# Patient Record
Sex: Male | Born: 1965 | Race: White | Hispanic: No | Marital: Single | Smoking: Current every day smoker
Health system: Southern US, Community
[De-identification: ages and names within clinical notes are randomized; demographics above are authoritative.]

## PROBLEM LIST (undated history)

## (undated) DIAGNOSIS — H269 Unspecified cataract: Secondary | ICD-10-CM

## (undated) DIAGNOSIS — M109 Gout, unspecified: Secondary | ICD-10-CM

## (undated) DIAGNOSIS — I10 Essential (primary) hypertension: Secondary | ICD-10-CM

## (undated) DIAGNOSIS — M1990 Unspecified osteoarthritis, unspecified site: Secondary | ICD-10-CM

## (undated) DIAGNOSIS — F1010 Alcohol abuse, uncomplicated: Secondary | ICD-10-CM

## (undated) DIAGNOSIS — F329 Major depressive disorder, single episode, unspecified: Secondary | ICD-10-CM

## (undated) DIAGNOSIS — J449 Chronic obstructive pulmonary disease, unspecified: Secondary | ICD-10-CM

## (undated) HISTORY — PX: HERNIA REPAIR: SHX51

## (undated) HISTORY — PX: CATARACT EXTRACTION: SUR2

## (undated) HISTORY — PX: OTHER SURGICAL HISTORY: SHX169

## (undated) HISTORY — DX: Alcohol abuse, uncomplicated: F10.10

## (undated) HISTORY — DX: Essential (primary) hypertension: I10

## (undated) HISTORY — DX: Chronic obstructive pulmonary disease, unspecified: J44.9

## (undated) HISTORY — PX: APPENDECTOMY: SHX54

## (undated) HISTORY — DX: Unspecified cataract: H26.9

## (undated) HISTORY — DX: Unspecified osteoarthritis, unspecified site: M19.90

## (undated) HISTORY — DX: Major depressive disorder, single episode, unspecified: F32.9

## (undated) HISTORY — DX: Gout, unspecified: M10.9

---

## 2001-07-08 ENCOUNTER — Encounter: Payer: Self-pay | Admitting: Orthopedic Surgery

## 2001-07-08 ENCOUNTER — Ambulatory Visit (HOSPITAL_COMMUNITY): Admission: RE | Admit: 2001-07-08 | Discharge: 2001-07-08 | Payer: Self-pay | Admitting: Orthopedic Surgery

## 2001-12-31 ENCOUNTER — Ambulatory Visit (HOSPITAL_COMMUNITY): Admission: RE | Admit: 2001-12-31 | Discharge: 2001-12-31 | Payer: Self-pay | Admitting: Neurosurgery

## 2013-06-30 DIAGNOSIS — H2510 Age-related nuclear cataract, unspecified eye: Secondary | ICD-10-CM | POA: Insufficient documentation

## 2013-07-07 DIAGNOSIS — Z961 Presence of intraocular lens: Secondary | ICD-10-CM | POA: Insufficient documentation

## 2013-07-07 DIAGNOSIS — H2512 Age-related nuclear cataract, left eye: Secondary | ICD-10-CM | POA: Insufficient documentation

## 2014-02-28 ENCOUNTER — Ambulatory Visit: Payer: Self-pay | Admitting: Physician Assistant

## 2014-03-02 ENCOUNTER — Encounter: Payer: Self-pay | Admitting: Physician Assistant

## 2014-03-02 ENCOUNTER — Ambulatory Visit (INDEPENDENT_AMBULATORY_CARE_PROVIDER_SITE_OTHER): Payer: BC Managed Care – PPO | Admitting: Physician Assistant

## 2014-03-02 VITALS — BP 133/89 | HR 73 | Ht 71.0 in | Wt 184.0 lb

## 2014-03-02 DIAGNOSIS — I10 Essential (primary) hypertension: Secondary | ICD-10-CM

## 2014-03-02 DIAGNOSIS — M109 Gout, unspecified: Secondary | ICD-10-CM

## 2014-03-02 DIAGNOSIS — M1000 Idiopathic gout, unspecified site: Secondary | ICD-10-CM

## 2014-03-02 DIAGNOSIS — Z720 Tobacco use: Secondary | ICD-10-CM

## 2014-03-02 DIAGNOSIS — Z0000 Encounter for general adult medical examination without abnormal findings: Secondary | ICD-10-CM

## 2014-03-02 DIAGNOSIS — Z131 Encounter for screening for diabetes mellitus: Secondary | ICD-10-CM

## 2014-03-02 DIAGNOSIS — Z23 Encounter for immunization: Secondary | ICD-10-CM

## 2014-03-02 DIAGNOSIS — R079 Chest pain, unspecified: Secondary | ICD-10-CM

## 2014-03-02 DIAGNOSIS — F17200 Nicotine dependence, unspecified, uncomplicated: Secondary | ICD-10-CM

## 2014-03-02 DIAGNOSIS — B351 Tinea unguium: Secondary | ICD-10-CM

## 2014-03-02 DIAGNOSIS — Z13220 Encounter for screening for lipoid disorders: Secondary | ICD-10-CM

## 2014-03-02 MED ORDER — OMEPRAZOLE 40 MG PO CPDR: 40.0000 mg | DELAYED_RELEASE_CAPSULE | Freq: Every day | ORAL | Status: DC

## 2014-03-02 MED ORDER — TERBINAFINE HCL 250 MG PO TABS: 250.0000 mg | ORAL_TABLET | Freq: Every day | ORAL | Status: DC

## 2014-03-02 NOTE — Patient Instructions (Addendum)
Stress test to be schedule.  Will get labs.  Start lamisil after lab result.     Gastroesophageal Reflux Disease, Adult Gastroesophageal reflux disease (GERD) happens when acid from your stomach flows up into the esophagus. When acid comes in contact with the esophagus, the acid causes soreness (inflammation) in the esophagus. Over time, GERD may create small holes (ulcers) in the lining of the esophagus. CAUSES   Increased body weight. This puts pressure on the stomach, making acid rise from the stomach into the esophagus.  Smoking. This increases acid production in the stomach.  Drinking alcohol. This causes decreased pressure in the lower esophageal sphincter (valve or ring of muscle between the esophagus and stomach), allowing acid from the stomach into the esophagus.  Late evening meals and a full stomach. This increases pressure and acid production in the stomach.  A malformed lower esophageal sphincter. Sometimes, no cause is found. SYMPTOMS   Burning pain in the lower part of the mid-chest behind the breastbone and in the mid-stomach area. This may occur twice a week or more often.  Trouble swallowing.  Sore throat.  Dry cough.  Asthma-like symptoms including chest tightness, shortness of breath, or wheezing. DIAGNOSIS  Your caregiver may be able to diagnose GERD based on your symptoms. In some cases, X-rays and other tests may be done to check for complications or to check the condition of your stomach and esophagus. TREATMENT  Your caregiver may recommend over-the-counter or prescription medicines to help decrease acid production. Ask your caregiver before starting or adding any new medicines.  HOME CARE INSTRUCTIONS   Change the factors that you can control. Ask your caregiver for guidance concerning weight loss, quitting smoking, and alcohol consumption.  Avoid foods and drinks that make your symptoms worse, such as:  Caffeine or alcoholic  drinks.  Chocolate.  Peppermint or mint flavorings.  Garlic and onions.  Spicy foods.  Citrus fruits, such as oranges, lemons, or limes.  Tomato-based foods such as sauce, chili, salsa, and pizza.  Fried and fatty foods.  Avoid lying down for the 3 hours prior to your bedtime or prior to taking a nap.  Eat small, frequent meals instead of large meals.  Wear loose-fitting clothing. Do not wear anything tight around your waist that causes pressure on your stomach.  Raise the head of your bed 6 to 8 inches with wood blocks to help you sleep. Extra pillows will not help.  Only take over-the-counter or prescription medicines for pain, discomfort, or fever as directed by your caregiver.  Do not take aspirin, ibuprofen, or other nonsteroidal anti-inflammatory drugs (NSAIDs). SEEK IMMEDIATE MEDICAL CARE IF:   You have pain in your arms, neck, jaw, teeth, or back.  Your pain increases or changes in intensity or duration.  You develop nausea, vomiting, or sweating (diaphoresis).  You develop shortness of breath, or you faint.  Your vomit is green, yellow, black, or looks like coffee grounds or blood.  Your stool is red, bloody, or black. These symptoms could be signs of other problems, such as heart disease, gastric bleeding, or esophageal bleeding. MAKE SURE YOU:   Understand these instructions.  Will watch your condition.  Will get help right away if you are not doing well or get worse. Document Released: 05/29/2005 Document Revised: 11/11/2011 Document Reviewed: 03/08/2011 [facility] Patient Information 2015 [place], [place]. This information is not intended to replace advice given to you by your health care provider. Make sure you discuss any questions you have with your  health care provider.  

## 2014-03-03 ENCOUNTER — Telehealth: Payer: Self-pay | Admitting: Physician Assistant

## 2014-03-03 ENCOUNTER — Telehealth: Payer: Self-pay | Admitting: *Deleted

## 2014-03-03 NOTE — Telephone Encounter (Signed)
No prior auth required for exercise tolerence test. Corliss SkainsJamie Amillia Biffle, CMA

## 2014-03-07 DIAGNOSIS — I10 Essential (primary) hypertension: Secondary | ICD-10-CM

## 2014-03-07 DIAGNOSIS — M109 Gout, unspecified: Secondary | ICD-10-CM

## 2014-03-07 DIAGNOSIS — R079 Chest pain, unspecified: Secondary | ICD-10-CM | POA: Insufficient documentation

## 2014-03-07 HISTORY — DX: Gout, unspecified: M10.9

## 2014-03-07 HISTORY — DX: Essential (primary) hypertension: I10

## 2014-03-07 NOTE — Progress Notes (Signed)
   Subjective:    Patient ID: Gwendlyn DeutscherJeffrey L Pitter, male    DOB: [birth year], 48 y.o.   MRN: [id number]  HPI Patient is a 48 year old male who presents to the clinic to establish care.  .. Active Ambulatory Problems    Diagnosis Date Noted  . Tobacco abuse 03/02/2014  . Gout 03/07/2014  . Acute gout 03/07/2014   Resolved Ambulatory Problems    Diagnosis Date Noted  . No Resolved Ambulatory Problems   No Additional Past Medical History   .Marland Kitchen.History reviewed. No pertinent family history. .. History   Social History  . Marital Status: Single    Spouse Name: N/A    Number of Children: N/A  . Years of Education: N/A   Occupational History  . Not on file.   Social History Main Topics  . Smoking status: Current Every Day Smoker  . Smokeless tobacco: Not on file  . Alcohol Use: Yes  . Drug Use: No  . Sexual Activity: Yes   Other Topics Concern  . Not on file   Social History Narrative  . No narrative on file    Patient presents to the clinic for medication refills and to discuss ongoing chest pain. Patient is on metoprolol 25 mg twice a day. He denies any palpitations, vision changes or headaches. He is having some regular sharp pains in his lower chest/epigastric area. He does notice them more after eating or when he is laying down. He has not noticed them with exertion. He has not tried anything to make better. He denies any fever, chills, nausea or vomiting. He denies any bowel changes. He describes the pain as sharp and can last anywhere from 1 minute to 30 minutes. It is happening at least 34 times a week. He denies any jaw pain or bilateral arm pain.  Patient is not currently on any medication for gout. He usually gets gout in his left foot. He has not had any management done in the last year or so.  He does have some nails that are very hard and turning colors. He would like treatment.    Review of Systems  All other systems reviewed and are negative.       Objective:   Physical Exam  Constitutional: He is oriented to person, place, and time. He appears well-developed and well-nourished.  HENT:  Head: Normocephalic and atraumatic.  Cardiovascular: Normal rate, regular rhythm and normal heart sounds.   Pulmonary/Chest: Effort normal and breath sounds normal. He has no wheezes.  Neurological: He is alert and oriented to person, place, and time.  Skin: Skin is dry.  Great toe bilaterally thick and yellow.   Psychiatric: He has a normal mood and affect. His behavior is normal.          Assessment & Plan:  chest pain- EKG NSR at 71. No ST elevation or depression. No Qwaves. Possible inferior infarct. Will get stress test to further evaluate. Continue on metroprolol daily. Concerned there could be a GERD component to chest pain. Start omeprazole 40mg  daily. Follow up in 4 weeks.   Gout- will wait to look at uric acid levels and kidney function to determine what preventive medication to place pt on.   Tobacco abuse- encouraged pt to quit smoking. He declined any intervention today.   Onychomycosis- will start lamisil after liver enzymes are received.   Screening labs were given to pt today to have drawn.

## 2014-03-09 ENCOUNTER — Ambulatory Visit (HOSPITAL_COMMUNITY)
Admission: RE | Admit: 2014-03-09 | Discharge: 2014-03-09 | Disposition: A | Discharge status: Home / Self Care | Payer: BC Managed Care – PPO | Source: Ambulatory Visit | Attending: Cardiology | Admitting: Cardiology

## 2014-03-09 ENCOUNTER — Other Ambulatory Visit (HOSPITAL_COMMUNITY): Payer: BC Managed Care – PPO

## 2014-03-10 ENCOUNTER — Ambulatory Visit (INDEPENDENT_AMBULATORY_CARE_PROVIDER_SITE_OTHER): Payer: BC Managed Care – PPO | Admitting: Family Medicine

## 2014-03-10 ENCOUNTER — Encounter: Payer: Self-pay | Admitting: Family Medicine

## 2014-03-10 VITALS — BP 152/97 | HR 90 | Wt 183.0 lb

## 2014-03-10 DIAGNOSIS — I861 Scrotal varices: Secondary | ICD-10-CM

## 2014-03-10 MED ORDER — MELOXICAM 15 MG PO TABS: 15.0000 mg | ORAL_TABLET | Freq: Every day | ORAL | Status: DC

## 2014-03-10 NOTE — Progress Notes (Signed)
CC: Michael DeutscherJeffrey L Meza is a 48 y.o. male is here for pulled muscle in groin?   Subjective: HPI:  Complains of right scrotal swelling and pain that began upon awakening 3 days ago. Since then it has slowly been getting better. It is described as having a swollen appearance moderate to mild in severity and a pain described only as pressure in the right scrotum. Pain is nonradiating. Worse to the touch, nothing else makes better or worse. He states it significantly improving without any intervention over the last 3 days. Denies penile discharge, dysuria, nor any other genitourinary complaints. Symptoms above are not influenced by Valsalva, any particular movements, and there have been no changes to his bowel habits. Denies nausea, constipation, fevers, chills, diarrhea, blood in stool.   Review Of Systems Outlined In HPI  No past medical history on file.  No past surgical history on file. No family history on file.  History   Social History  . Marital Status: Single    Spouse Name: N/A    Number of Children: N/A  . Years of Education: N/A   Occupational History  . Not on file.   Social History Main Topics  . Smoking status: Current Every Day Smoker  . Smokeless tobacco: Not on file  . Alcohol Use: Yes  . Drug Use: No  . Sexual Activity: Yes   Other Topics Concern  . Not on file   Social History Narrative  . No narrative on file     Objective: BP 152/97  Pulse 90  Wt 183 lb (83.008 kg)  General: Alert and Oriented, No Acute Distress HEENT: Pupils equal, round, reactive to light. Conjunctivae clear.   Lungs: Clear to auscultation bilaterally, no wheezing/ronchi/rales.  Comfortable work of breathing. Good air movement. Cardiac: Regular rate and rhythm. Normal S1/S2.  No murmurs, rubs, nor gallops.   Abdomen: Normal bowel sounds, soft and non tender without palpable masses. Genitourinary: Unremarkable penis, the left scrotum is slightly tender to touch with a "bag of worms"  feel to the inside of the scrotum along with a nontender testicle with a vertical lie without palpable abnormality. No swelling or tenderness in the inguinal canal Mental Status: No depression, anxiety, nor agitation. Skin: Warm and dry.  Assessment & Plan: Michael Meza was seen today for pulled muscle in groin?.  Diagnoses and associated orders for this visit:  Varicocele - meloxicam (MOBIC) 15 MG tablet; Take 1 tablet (15 mg total) by mouth daily.    Discussed with patient my suspicion of varicocele therefore start meloxicam for pain control, if symptoms are not any better by Monday please call me and I can order a scrotal ultrasound for further evaluation. If symptoms worsen between now and then we can order the ultrasound sooner.Signs and symptoms requring emergent/urgent reevaluation were discussed with the patient.  Return if symptoms worsen or fail to improve.

## 2014-03-10 NOTE — Patient Instructions (Addendum)
Call if no improvement by Monday, if not better we'll want to get an ultrasound.   Varicocele A varicocele is a swelling of veins in the scrotum (the bag of skin that contains the testicles). It is most common in young men. It occurs most often on the left side. Small or painless varicoceles do not need treatment. Most often, this is not a serious problem, but further tests may be needed to confirm the diagnosis. Surgery may be needed if complications of varicoceles arise. Rarely, varicoceles can reoccur after surgery. CAUSES  The swelling is due to blood backing up in the vein that leads from the testicle back to the body. Blood backs up because the valves inside the vein are not working properly. Veins normally return blood to the heart. Valves in veins are supposed to be one-way valves. They should not allow blood to flow backwards. If the valves do not work well, blood can pool in a vein and make it swell. The same thing happens with varicose veins in the leg. SYMPTOMS  A varicocele most often causes no symptoms. When they occur, symptoms include:   Swelling on one side of the scrotum.  Swelling that is more obvious when standing up.  A lumpy feeling in the scrotum.  Heaviness on one side of the scrotum.  Dull ache in the scrotum, especially after exercise or prolonged standing or sitting.  Slower growth or reduced size of the testicle on the side of the varicocele (in young males).  Problems with fertility can arise if the testicle does not grow normally. DIAGNOSIS  Varicocele is usually diagnosed by a physical exam. Sometimes ultrasonography is done. TREATMENT  Usually, varicoceles need no treatment. They are often routinely monitored on exam by your caregiver to ensure they do not slow the growth of the testicle on that side. Treatment may be needed if:  The varicocele is large.  There is a lot of pain.  The varicocele causes a decrease in the size of the testicle in a growing  adolescent.  The other testicle is absent or not normal.  Varicoceles are found on both sides of the scrotum.  There is pain when exercising.  There are fertility problems. There are two types of treatment:  Surgery. The surgeon ties off the swollen veins. Surgery may be done with an incision in the skin or through a laparoscope. The surgery is usually done in an outpatient setting. Outpatient means there is no overnight stay in a hospital.  Embolization. A small tube is placed in a vein and guided into the swollen veins. X-rays are used to guide the small tube. Tiny metal coils or other blocking items are put through the tube. This blocks swollen veins and the flow of blood. This is usually done in an outpatient setting without the use of general anesthesia. HOME CARE INSTRUCTIONS  To decrease discomfort:  Wear supportive underwear.  Use an athletic supporter for sports.  Only take over-the-counter or prescription medicines for pain or discomfort as directed by your caregiver. SEEK MEDICAL CARE IF:   Pain is increasing.  Swelling does not decrease when lying down.  Testicle is smaller.  The testicle becomes enlarged, swollen, red, or painful. Document Released: 11/25/2000 Document Revised: 11/11/2011 Document Reviewed: 11/29/2009 [facility] Patient Information 2015 [place], [place]. This information is not intended to replace advice given to you by your health care provider. Make sure you discuss any questions you have with your health care provider.

## 2014-03-15 ENCOUNTER — Ambulatory Visit (HOSPITAL_COMMUNITY)
Admission: RE | Admit: 2014-03-15 | Discharge: 2014-03-15 | Disposition: A | Discharge status: Home / Self Care | Payer: BC Managed Care – PPO | Source: Ambulatory Visit | Attending: Cardiology | Admitting: Cardiology

## 2014-03-15 DIAGNOSIS — R079 Chest pain, unspecified: Secondary | ICD-10-CM

## 2014-03-15 NOTE — Procedures (Signed)
Exercise Treadmill Test   Test  Exercise Tolerance Test Ordering MD: Tandy GawJade Breeback, PA-C  Interpreting MD:   Unique Test No:1  Treadmill:  1  Indication for ETT: chest pain - rule out ischemia  Contraindication to ETT: Yes   Stress Modality: exercise - treadmill  Cardiac Imaging Performed: non   Protocol: standard Bruce - maximal  Max BP:  208/105  Max MPHR (bpm):  171 85% MPR (bpm):  145  MPHR obtained (bpm):  148 % MPHR obtained:  85  Reached 85% MPHR (min:sec): 9:00 Total Exercise Time (min-sec):  9:00  Workload in METS: 10.10 Borg Scale:   Reason ETT Terminated:  fatigue    ST Segment Analysis At Rest: Sinus rhythm; cannot R/O prior septal MI; no ST changes. With Exercise: no evidence of significant ST depression  Other Information Arrhythmia:  Occasional PVC. Angina during ETT:  absent (0) Quality of ETT:  diagnostic  ETT Interpretation:  normal - no evidence of ischemia by ST analysis  Comments: ETT with no chest pain but the patient did complain of dyspnea; Hypertensive BP response (peak 208/105); no ST changes; negative adequate ETT.  Olga MillersBrian Crenshaw

## 2014-03-16 ENCOUNTER — Telehealth (HOSPITAL_COMMUNITY): Payer: Self-pay | Admitting: *Deleted

## 2014-03-16 ENCOUNTER — Encounter (HOSPITAL_COMMUNITY): Payer: BC Managed Care – PPO

## 2014-03-17 ENCOUNTER — Telehealth: Payer: Self-pay | Admitting: *Deleted

## 2014-03-17 NOTE — Telephone Encounter (Signed)
Spoke with pt to give him his stress test results.  He said that he needed a letter stating that his heart is good so Michael Meza will let him go back to work.

## 2014-03-17 NOTE — Telephone Encounter (Signed)
Ok for letter stating stress test and EKG as well as labwork have been negative for any cardiac cause of chest pains. He is able to return to work with no restrictions.

## 2014-03-17 NOTE — Telephone Encounter (Signed)
Letter in your basket to be signed.

## 2014-03-23 ENCOUNTER — Telehealth: Payer: Self-pay | Admitting: *Deleted

## 2014-03-23 NOTE — Telephone Encounter (Signed)
Pt notified of letter and [facility] at [phone].

## 2014-03-23 NOTE — Telephone Encounter (Signed)
Letter in your in box. 

## 2014-03-23 NOTE — Telephone Encounter (Signed)
Pt called stating that his job needs a note stating that we gave him omeprazole for his chest pains since he had an ok ekg & negative stress test.  I've already sent them all the office notes as well as the ekg & stress test results. He needs this quickly so he can get back to work.

## 2014-03-28 ENCOUNTER — Ambulatory Visit: Payer: BC Managed Care – PPO | Admitting: Physician Assistant

## 2014-04-06 ENCOUNTER — Other Ambulatory Visit: Payer: Self-pay | Admitting: Family Medicine

## 2014-04-11 ENCOUNTER — Ambulatory Visit: Payer: BC Managed Care – PPO | Admitting: Physician Assistant

## 2014-04-18 NOTE — Addendum Note (Signed)
Addended by: Chalmers CaterUTTLE, Jayveion Stalling H on: 04/18/2014 01:53 PM   Modules accepted: Orders

## 2014-05-11 ENCOUNTER — Other Ambulatory Visit: Payer: Self-pay | Admitting: Physician Assistant

## 2014-06-12 ENCOUNTER — Other Ambulatory Visit: Payer: Self-pay | Admitting: Physician Assistant

## 2014-06-14 ENCOUNTER — Ambulatory Visit (INDEPENDENT_AMBULATORY_CARE_PROVIDER_SITE_OTHER): Payer: BC Managed Care – PPO | Admitting: Sports Medicine

## 2014-06-14 ENCOUNTER — Encounter: Payer: Self-pay | Admitting: Sports Medicine

## 2014-06-14 ENCOUNTER — Ambulatory Visit (INDEPENDENT_AMBULATORY_CARE_PROVIDER_SITE_OTHER): Payer: BC Managed Care – PPO

## 2014-06-14 VITALS — BP 130/82 | HR 83 | Ht 71.0 in | Wt 180.0 lb

## 2014-06-14 DIAGNOSIS — M79671 Pain in right foot: Secondary | ICD-10-CM | POA: Diagnosis not present

## 2014-06-14 DIAGNOSIS — R609 Edema, unspecified: Secondary | ICD-10-CM

## 2014-06-14 MED ORDER — DICLOFENAC SODIUM 75 MG PO TBEC: 75.0000 mg | DELAYED_RELEASE_TABLET | Freq: Two times a day (BID) | ORAL | Status: DC

## 2014-06-14 NOTE — Progress Notes (Signed)
   Subjective:    I'm seeing this patient as a consultation for:  Tandy GawJade Breeback, PA-C  CC: Right foot pain  HPI: This is a very pleasant 48 year old male, one month ago he inverted his right ankle, since then he's had pain on the dorsal lateral midfoot, without radiation, there is also visible and palpable nodule that is tender. Moderate, persistent without radiation.  Past medical history, Surgical history, Family history not pertinant except as noted below, Social history, Allergies, and medications have been entered into the medical record, reviewed, and no changes needed.   Review of Systems: No headache, visual changes, nausea, vomiting, diarrhea, constipation, dizziness, abdominal pain, skin rash, fevers, chills, night sweats, weight loss, swollen lymph nodes, body aches, joint swelling, muscle aches, chest pain, shortness of breath, mood changes, visual or auditory hallucinations.   Objective:   General: Well Developed, well nourished, and in no acute distress.  Neuro/Psych: Alert and oriented x3, extra-ocular muscles intact, able to move all 4 extremities, sensation grossly intact. Skin: Warm and dry, no rashes noted.  Respiratory: Not using accessory muscles, speaking in full sentences, trachea midline.  Cardiovascular: Pulses palpable, no extremity edema. Abdomen: Does not appear distended. Right Foot: No visible erythema or swelling. Range of motion is full in all directions. Strength is 5/5 in all directions. No hallux valgus. No pes cavus or pes planus. No abnormal callus noted. No pain over the navicular prominence, or base of fifth metatarsal. No tenderness to palpation of the calcaneal insertion of plantar fascia. No pain at the Achilles insertion. No pain over the calcaneal bursa. No pain of the retrocalcaneal bursa. Visible nodule with tenderness to palpation at the dorsal lateral midfoot. No hallux rigidus or limitus. No tenderness palpation over interphalangeal  joints. No pain with compression of the metatarsal heads. Neurovascularly intact distally.  No evidence of fracture, this is likely a ganglion cyst.  Impression and Recommendations:   This case required medical decision making of moderate complexity.

## 2014-06-14 NOTE — Assessment & Plan Note (Signed)
There does appear to be a ganglion cyst dorsal laterally between the cuboid and the talus. He does endorse an injury. There is significant pes cavus. X-rays, strapped with compressive dressing, Voltaren. Return for custom orthotics, then return in 2 weeks for interventional treatment if no better.

## 2014-07-04 ENCOUNTER — Ambulatory Visit (INDEPENDENT_AMBULATORY_CARE_PROVIDER_SITE_OTHER): Payer: BC Managed Care – PPO | Admitting: Sports Medicine

## 2014-07-04 ENCOUNTER — Encounter: Payer: Self-pay | Admitting: Sports Medicine

## 2014-07-04 VITALS — BP 144/86 | HR 68 | Ht 71.0 in | Wt 188.0 lb

## 2014-07-04 DIAGNOSIS — M79671 Pain in right foot: Secondary | ICD-10-CM

## 2014-07-04 NOTE — Progress Notes (Signed)

## 2014-07-04 NOTE — Assessment & Plan Note (Signed)
Custom orthotics as above. There does appear to be a ganglion cyst dorsolaterally between the cuboid and the talus. Return to see me in 2-3 weeks, injection if no better.

## 2014-07-18 ENCOUNTER — Ambulatory Visit: Payer: BC Managed Care – PPO | Admitting: Sports Medicine

## 2014-07-29 ENCOUNTER — Other Ambulatory Visit: Payer: Self-pay | Admitting: Physician Assistant

## 2014-08-01 ENCOUNTER — Telehealth: Payer: Self-pay | Admitting: *Deleted

## 2014-08-01 ENCOUNTER — Telehealth: Payer: Self-pay | Admitting: Sports Medicine

## 2014-08-01 MED ORDER — DIAZEPAM 5 MG PO TABS: ORAL_TABLET | ORAL | Status: DC

## 2014-08-01 NOTE — Telephone Encounter (Signed)
Michael Meza called and lmom for a call back regarding appt for ganglion cyst. Voicemail full cannot accept any more calls. Michael SkainsJamie Versia Meza, CMA

## 2014-08-01 NOTE — Telephone Encounter (Signed)
Trey PaulaJeff returned my call regarding appointment for cyst removal. He was inquiring about Xanax. Please advise. Corliss SkainsJamie Tocarra Gassen, CMA

## 2014-08-01 NOTE — Telephone Encounter (Signed)
Patient called back and he has an appointment for this Thurs with Dr T and is requesting Xnanx for his nerves. Pt stated that he discussed with Dr. Karie Schwalbe about script. Thanks

## 2014-08-01 NOTE — Telephone Encounter (Signed)
Let's do Valium instead. Prescription is in my box.

## 2014-08-02 NOTE — Telephone Encounter (Signed)
Patient notified. Ralf Konopka, CMA 

## 2014-08-04 ENCOUNTER — Ambulatory Visit (INDEPENDENT_AMBULATORY_CARE_PROVIDER_SITE_OTHER): Payer: BC Managed Care – PPO | Admitting: Sports Medicine

## 2014-08-04 ENCOUNTER — Encounter: Payer: Self-pay | Admitting: Sports Medicine

## 2014-08-04 DIAGNOSIS — M79671 Pain in right foot: Secondary | ICD-10-CM

## 2014-08-04 MED ORDER — CELECOXIB 200 MG PO CAPS: ORAL_CAPSULE | ORAL | Status: DC

## 2014-08-04 NOTE — Progress Notes (Signed)
  Subjective:    CC: Follow-up  HPI: Michael Meza returns, I have not seen him in some time, we made him custom orthotics and he improved significantly from a standpoint of his right foot, he continues to have pain that he localizes at the calcaneal cuboid joint laterally, moderate, persistent without radiation. He also has some disability paperwork that he needs filled out.  Past medical history, Surgical history, Family history not pertinant except as noted below, Social history, Allergies, and medications have been entered into the medical record, reviewed, and no changes needed.   Review of Systems: No fevers, chills, night sweats, weight loss, chest pain, or shortness of breath.   Objective:    General: Well Developed, well nourished, and in no acute distress.  Neuro: Alert and oriented x3, extra-ocular muscles intact, sensation grossly intact.  HEENT: Normocephalic, atraumatic, pupils equal round reactive to light, neck supple, no masses, no lymphadenopathy, thyroid nonpalpable.  Skin: Warm and dry, no rashes. Cardiac: Regular rate and rhythm, no murmurs rubs or gallops, no lower extremity edema.  Respiratory: Clear to auscultation bilaterally. Not using accessory muscles, speaking in full sentences. Right Foot: No visible erythema or swelling. Range of motion is full in all directions. Strength is 5/5 in all directions. No hallux valgus. No pes cavus or pes planus. No abnormal callus noted. No pain over the navicular prominence, or base of fifth metatarsal. No tenderness to palpation of the calcaneal insertion of plantar fascia. No pain at the Achilles insertion. No pain over the calcaneal bursa. No pain of the retrocalcaneal bursa. Tender to palpation at the calcaneal cuboid joint. No hallux rigidus or limitus. No tenderness palpation over interphalangeal joints. No pain with compression of the metatarsal heads. Neurovascularly intact distally.  Procedure: Real-time Ultrasound  Guided Injection of right calcaneocuboid joint Device: GE Logiq E  Verbal informed consent obtained.  Time-out conducted.  Noted no overlying erythema, induration, or other signs of local infection.  Skin prepped in a sterile fashion.  Local anesthesia: Topical Ethyl chloride.  With sterile technique and under real time ultrasound guidance:  1 mL kenalog 40, 1 mL lidocaine injected easily into the calcaneocuboid joint. Completed without difficulty  Pain immediately resolved suggesting accurate placement of the medication.  Advised to call if fevers/chills, erythema, induration, drainage, or persistent bleeding.  Images permanently stored and available for review in the ultrasound unit.  Impression: Technically successful ultrasound guided injection.  Impression and Recommendations:

## 2014-08-04 NOTE — Assessment & Plan Note (Signed)
Injection placed into the calcaneal cuboid joint. Continue custom orthotics. He is getting some gastritis with diclofenac, we are going to switch to Celebrex. Return in one month. Short-term disability forms filled out today.

## 2014-08-22 ENCOUNTER — Ambulatory Visit: Payer: BC Managed Care – PPO | Admitting: Sports Medicine

## 2014-08-22 DIAGNOSIS — Z0289 Encounter for other administrative examinations: Secondary | ICD-10-CM

## 2014-08-31 ENCOUNTER — Ambulatory Visit (INDEPENDENT_AMBULATORY_CARE_PROVIDER_SITE_OTHER): Payer: BC Managed Care – PPO | Admitting: Physician Assistant

## 2014-08-31 ENCOUNTER — Encounter: Payer: Self-pay | Admitting: Physician Assistant

## 2014-08-31 VITALS — BP 157/96 | HR 70 | Ht 71.0 in | Wt 186.0 lb

## 2014-08-31 DIAGNOSIS — F411 Generalized anxiety disorder: Secondary | ICD-10-CM

## 2014-08-31 DIAGNOSIS — F32A Depression, unspecified: Secondary | ICD-10-CM

## 2014-08-31 DIAGNOSIS — Z720 Tobacco use: Secondary | ICD-10-CM

## 2014-08-31 DIAGNOSIS — I10 Essential (primary) hypertension: Secondary | ICD-10-CM

## 2014-08-31 DIAGNOSIS — F329 Major depressive disorder, single episode, unspecified: Secondary | ICD-10-CM

## 2014-08-31 DIAGNOSIS — F430 Acute stress reaction: Principal | ICD-10-CM

## 2014-08-31 DIAGNOSIS — F419 Anxiety disorder, unspecified: Secondary | ICD-10-CM | POA: Insufficient documentation

## 2014-08-31 HISTORY — DX: Depression, unspecified: F32.A

## 2014-08-31 MED ORDER — METOPROLOL TARTRATE 50 MG PO TABS: 50.0000 mg | ORAL_TABLET | Freq: Two times a day (BID) | ORAL | Status: DC

## 2014-08-31 MED ORDER — DIAZEPAM 2 MG PO TABS: 2.0000 mg | ORAL_TABLET | Freq: Two times a day (BID) | ORAL | Status: DC | PRN

## 2014-08-31 NOTE — Progress Notes (Signed)
   Subjective:    Patient ID: Michael DeutscherJeffrey L Capozzi, male    DOB: 1965/10/11, 48 y.o.   MRN: [id number]  HPI Pt presents to the clinic with worsening anxiety. Patient admits he has had some ongoing anxiety and depressive films for the last couple months. However he did lose his job this week and has added to the anxiety and stress. In the past he has gone through periods where he has needed Valium to help him through situations. He has used some of his mother Xanax but that tends to make him like a zombie and not come out. He does not want to try daily medication at this time. He denies any suicidal or homicidal thoughts. He is in the process of interviewing for another job and hopes to be employed by the beginning of the year.  Hypertension-pink his excuses of why his blood pressure is high today he does smoke and drinks a lot of coffee. He denies any chest pains, palpitations, vision changes. He is taking his metoprolol twice a day.   Review of Systems  All other systems reviewed and are negative.      Objective:   Physical Exam  Constitutional: He is oriented to person, place, and time. He appears well-developed and well-nourished.  HENT:  Head: Normocephalic and atraumatic.  Cardiovascular: Normal rate, regular rhythm and normal heart sounds.   Pulmonary/Chest: Effort normal and breath sounds normal.  Neurological: He is alert and oriented to person, place, and time.  Skin: Skin is dry.  Psychiatric: He has a normal mood and affect. His behavior is normal.          Assessment & Plan:  Acute anxiety due to stress/depression- PHQ-9 was 13. GAD-7 was 7. Patient has responded to Valium in the past. I did give him 2 mg as needed twice a day. I discussed with patient this is not a permanent treatment for ongoing anxiety. His symptoms continue we need to talk about a daily medication. Patient is very resistant to this treatment. Also discussed the possibility of talking things out with a  counselor. He declines any counselor referral at this time.   Hypertension- increased metoprolol to 50mg  twice a day. Follow up with recheck in one month. Discussed low salt diet.   Tobacco abuse-obviously this was discussed today. I do not think now also time to quit smoking. We need to get him through this acute anxiety and hopefully focus on this in the next couple months.

## 2014-09-05 ENCOUNTER — Ambulatory Visit: Payer: BC Managed Care – PPO | Admitting: Sports Medicine

## 2014-09-05 DIAGNOSIS — Z0289 Encounter for other administrative examinations: Secondary | ICD-10-CM

## 2014-09-30 ENCOUNTER — Ambulatory Visit: Payer: BC Managed Care – PPO | Admitting: Physician Assistant

## 2014-09-30 ENCOUNTER — Encounter: Payer: Self-pay | Admitting: Physician Assistant

## 2014-09-30 ENCOUNTER — Ambulatory Visit (INDEPENDENT_AMBULATORY_CARE_PROVIDER_SITE_OTHER): Payer: Self-pay | Admitting: Sports Medicine

## 2014-09-30 ENCOUNTER — Ambulatory Visit (INDEPENDENT_AMBULATORY_CARE_PROVIDER_SITE_OTHER): Payer: Self-pay | Admitting: Physician Assistant

## 2014-09-30 ENCOUNTER — Encounter: Payer: Self-pay | Admitting: Sports Medicine

## 2014-09-30 VITALS — BP 150/99 | HR 70 | Ht 71.0 in | Wt 195.0 lb

## 2014-09-30 VITALS — BP 152/89 | HR 70 | Ht 71.0 in | Wt 195.0 lb

## 2014-09-30 DIAGNOSIS — I10 Essential (primary) hypertension: Secondary | ICD-10-CM

## 2014-09-30 DIAGNOSIS — F430 Acute stress reaction: Secondary | ICD-10-CM

## 2014-09-30 DIAGNOSIS — F411 Generalized anxiety disorder: Secondary | ICD-10-CM

## 2014-09-30 DIAGNOSIS — F419 Anxiety disorder, unspecified: Secondary | ICD-10-CM

## 2014-09-30 DIAGNOSIS — Z720 Tobacco use: Secondary | ICD-10-CM

## 2014-09-30 DIAGNOSIS — M79671 Pain in right foot: Secondary | ICD-10-CM

## 2014-09-30 MED ORDER — AMLODIPINE BESYLATE 5 MG PO TABS: 5.0000 mg | ORAL_TABLET | Freq: Every day | ORAL | Status: DC

## 2014-09-30 MED ORDER — METOPROLOL TARTRATE 50 MG PO TABS: 50.0000 mg | ORAL_TABLET | Freq: Two times a day (BID) | ORAL | Status: DC

## 2014-09-30 MED ORDER — DIAZEPAM 2 MG PO TABS: 2.0000 mg | ORAL_TABLET | Freq: Two times a day (BID) | ORAL | Status: DC | PRN

## 2014-09-30 NOTE — Progress Notes (Signed)
   Subjective:    Patient ID: Michael DeutscherJeffrey L Meza, male    DOB: 01-Feb-1966, 49 y.o.   MRN: [id number]  HPI Pt presents to the clinic to follow up on acute anxiety.   Anxiety has improved significantly. 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 He is taking valium but not everyday. It does help some. No suicidal or homicidal thoughts.   HTN- denies any CP, palpitations, headaches or vision changes. Taking metoprolol twice daily. Not checking bp's at home.    Review of Systems  All other systems reviewed and are negative.      Objective:   Physical Exam  Constitutional: He is oriented to person, place, and time. He appears well-developed and well-nourished.  HENT:  Head: Normocephalic and atraumatic.  Cardiovascular: Normal rate, regular rhythm and normal heart sounds.   Pulmonary/Chest: Effort normal and breath sounds normal. He has no wheezes.  Neurological: He is alert and oriented to person, place, and time.  Psychiatric: He has a normal mood and affect. His behavior is normal.          Assessment & Plan:  Anxiety as acute reaction to stress- GAD-7 was 2. PHQ-9 was 5. Refilled Valium. Discussed not use unless needed. Goal would be 1-3 times a week. If needing more needs to discuss daily medication. Appears that getting a new job has decreased anxiety. Follow up in 3 months.   HTN- BP elevated today. Per pt he states always elevated at doctors office. Given norvasc to start do not want to increase metoprolol since HR might drop too low. Discussed can give me 2 weeks of under 140/90 BP's readings at home then does not need to start. If not needs to start norvasc, if start follow up in 1 month if not then Follow up in 3 months.   Needs CPE. Should have insurance soon. Needs labs.   Tobacco abuse- not ready to quit today.

## 2014-09-30 NOTE — Progress Notes (Signed)
  Subjective:    CC: follow-up  HPI: Right foot pain: Overall pain-free now after custom orthotics and a right calcaneocuboid joint injection as above.  He did lose his job, and is coming off of temporary disability, he did find a job [place] in [place]. He does have some forms he needs me to fill out.  Past medical history, Surgical history, Family history not pertinant except as noted below, Social history, Allergies, and medications have been entered into the medical record, reviewed, and no changes needed.   Review of Systems: No fevers, chills, night sweats, weight loss, chest pain, or shortness of breath.   Objective:    General: Well Developed, well nourished, and in no acute distress.  Neuro: Alert and oriented x3, extra-ocular muscles intact, sensation grossly intact.  HEENT: Normocephalic, atraumatic, pupils equal round reactive to light, neck supple, no masses, no lymphadenopathy, thyroid nonpalpable.  Skin: Warm and dry, no rashes. Cardiac: Regular rate and rhythm, no murmurs rubs or gallops, no lower extremity edema.  Respiratory: Clear to auscultation bilaterally. Not using accessory muscles, speaking in full sentences. Right Foot: No visible erythema or swelling. Range of motion is full in all directions. Strength is 5/5 in all directions. No hallux valgus. No pes cavus or pes planus. No abnormal callus noted. No pain over the navicular prominence, or base of fifth metatarsal. No tenderness to palpation of the calcaneal insertion of plantar fascia. No pain at the Achilles insertion. No pain over the calcaneal bursa. No pain of the retrocalcaneal bursa. No tenderness to palpation over the tarsals, metatarsals, or phalanges. No hallux rigidus or limitus. No tenderness palpation over interphalangeal joints. No pain with compression of the metatarsal heads. Neurovascularly intact distally.  I spent 25 minutes with this patient, greater than 50% was face-to-face time  counseling regarding the above diagnosis as well as completing forms.  Impression and Recommendations:

## 2014-09-30 NOTE — Assessment & Plan Note (Signed)
Pain-free now after custom orthotics and a calcaneocuboid joint injection at the last visit. He is going to start a new job in [place] coming up. [employer] paperwork filled out today for his short term disability. Return as needed.

## 2014-11-01 ENCOUNTER — Other Ambulatory Visit: Payer: Self-pay | Admitting: Physician Assistant

## 2014-11-08 ENCOUNTER — Other Ambulatory Visit: Payer: Self-pay | Admitting: Physician Assistant

## 2014-11-16 ENCOUNTER — Other Ambulatory Visit: Payer: Self-pay | Admitting: Physician Assistant

## 2014-12-07 ENCOUNTER — Encounter: Payer: Self-pay | Admitting: Physician Assistant

## 2014-12-19 ENCOUNTER — Encounter: Payer: Self-pay | Admitting: Physician Assistant

## 2014-12-21 ENCOUNTER — Encounter: Payer: Self-pay | Admitting: Physician Assistant

## 2014-12-24 ENCOUNTER — Other Ambulatory Visit: Payer: Self-pay | Admitting: Physician Assistant

## 2015-02-06 ENCOUNTER — Other Ambulatory Visit: Payer: Self-pay | Admitting: Physician Assistant

## 2015-02-27 ENCOUNTER — Other Ambulatory Visit: Payer: Self-pay | Admitting: *Deleted

## 2015-02-27 ENCOUNTER — Telehealth: Payer: Self-pay | Admitting: Physician Assistant

## 2015-02-27 MED ORDER — METOPROLOL TARTRATE 50 MG PO TABS: 50.0000 mg | ORAL_TABLET | Freq: Two times a day (BID) | ORAL | Status: DC

## 2015-02-27 MED ORDER — AMLODIPINE BESYLATE 5 MG PO TABS: 5.0000 mg | ORAL_TABLET | Freq: Every day | ORAL | Status: DC

## 2015-02-27 NOTE — Telephone Encounter (Signed)
Patient called and request to know if he can have a med refill for prilosec, bp med & diazepam. Pt states his insurance runs out at the end of June and trying to save money with insurance cost $14.00 and with out its $140.00. Req to have meds called into pharmacy. Thanks

## 2015-02-27 NOTE — Telephone Encounter (Signed)
Pt notified to make an appt when he's back in town.

## 2015-02-27 NOTE — Telephone Encounter (Signed)
I've already refilled his BP meds but are you ok with refilling his diazepam? Please advise.

## 2015-02-27 NOTE — Telephone Encounter (Signed)
Since diazepam is a controlled substances this would still require office visits.

## 2015-03-01 ENCOUNTER — Encounter: Payer: Self-pay | Admitting: Family Medicine

## 2015-05-22 ENCOUNTER — Encounter: Payer: Self-pay | Admitting: Physician Assistant

## 2015-05-23 ENCOUNTER — Telehealth: Payer: Self-pay | Admitting: Physician Assistant

## 2015-05-23 ENCOUNTER — Other Ambulatory Visit: Payer: Self-pay | Admitting: Physician Assistant

## 2015-05-23 NOTE — Telephone Encounter (Signed)
Pt called.Marland Kitchen He wants to be put on Colchicine 0.6 mg this is the med his previous Dr had him on for Gout. The med we prescribed is too expensive, its costing him $300 whilst the other $14.00.  Thank you.

## 2015-05-23 NOTE — Telephone Encounter (Signed)
[name], Pt called.  He would like rx for Colchine 0.6 mg for his gout. His pcp prior to seeing you prescribed this med, he said the one prescribed by you is too expensive. He's been out of work because of the amount of pain he's been  in and is trying to go back to work tomorrow.  Thank you.

## 2015-05-24 ENCOUNTER — Other Ambulatory Visit: Payer: Self-pay | Admitting: Physician Assistant

## 2015-05-24 MED ORDER — COLCHICINE 0.6 MG PO TABS: ORAL_TABLET | ORAL | Status: DC

## 2015-06-15 NOTE — Telephone Encounter (Signed)
Encounter complete. 

## 2015-06-18 ENCOUNTER — Other Ambulatory Visit: Payer: Self-pay | Admitting: Physician Assistant

## 2015-07-04 ENCOUNTER — Telehealth: Payer: Self-pay | Admitting: Physician Assistant

## 2015-07-04 ENCOUNTER — Ambulatory Visit: Payer: Self-pay | Admitting: Physician Assistant

## 2015-07-04 NOTE — Telephone Encounter (Signed)
We need to recheck uric acid. We do not have on file. Likely needs to start a preventative. We really need appt. Do you have colchine?

## 2015-07-04 NOTE — Telephone Encounter (Signed)
Pt called. Gout medicine is not working and he wants to change to something else.

## 2015-07-21 ENCOUNTER — Other Ambulatory Visit: Payer: Self-pay | Admitting: Physician Assistant

## 2015-08-03 NOTE — Telephone Encounter (Signed)
Pt made several appts for this but never came in.

## 2015-08-04 ENCOUNTER — Other Ambulatory Visit: Payer: Self-pay

## 2015-08-04 MED ORDER — METOPROLOL TARTRATE 50 MG PO TABS: ORAL_TABLET | ORAL | Status: DC

## 2015-08-31 ENCOUNTER — Other Ambulatory Visit: Payer: Self-pay | Admitting: Physician Assistant

## 2015-09-01 ENCOUNTER — Ambulatory Visit (INDEPENDENT_AMBULATORY_CARE_PROVIDER_SITE_OTHER): Payer: Commercial Managed Care - PPO | Admitting: Physician Assistant

## 2015-09-01 ENCOUNTER — Encounter: Payer: Self-pay | Admitting: Physician Assistant

## 2015-09-01 ENCOUNTER — Ambulatory Visit (INDEPENDENT_AMBULATORY_CARE_PROVIDER_SITE_OTHER): Payer: Commercial Managed Care - PPO

## 2015-09-01 ENCOUNTER — Other Ambulatory Visit: Payer: Self-pay | Admitting: Family Medicine

## 2015-09-01 VITALS — BP 138/94 | HR 86 | Ht 71.0 in | Wt 187.0 lb

## 2015-09-01 DIAGNOSIS — F411 Generalized anxiety disorder: Secondary | ICD-10-CM

## 2015-09-01 DIAGNOSIS — Z0000 Encounter for general adult medical examination without abnormal findings: Secondary | ICD-10-CM

## 2015-09-01 DIAGNOSIS — Z23 Encounter for immunization: Secondary | ICD-10-CM

## 2015-09-01 DIAGNOSIS — Z13220 Encounter for screening for lipoid disorders: Secondary | ICD-10-CM

## 2015-09-01 DIAGNOSIS — M79672 Pain in left foot: Secondary | ICD-10-CM | POA: Diagnosis not present

## 2015-09-01 DIAGNOSIS — Z0001 Encounter for general adult medical examination with abnormal findings: Secondary | ICD-10-CM | POA: Diagnosis not present

## 2015-09-01 DIAGNOSIS — Z720 Tobacco use: Secondary | ICD-10-CM

## 2015-09-01 DIAGNOSIS — I10 Essential (primary) hypertension: Secondary | ICD-10-CM

## 2015-09-01 DIAGNOSIS — M79671 Pain in right foot: Secondary | ICD-10-CM

## 2015-09-01 DIAGNOSIS — Z131 Encounter for screening for diabetes mellitus: Secondary | ICD-10-CM

## 2015-09-01 DIAGNOSIS — F430 Acute stress reaction: Secondary | ICD-10-CM

## 2015-09-01 DIAGNOSIS — M79676 Pain in unspecified toe(s): Secondary | ICD-10-CM

## 2015-09-01 DIAGNOSIS — F419 Anxiety disorder, unspecified: Secondary | ICD-10-CM

## 2015-09-01 MED ORDER — TERBINAFINE HCL 250 MG PO TABS: 250.0000 mg | ORAL_TABLET | Freq: Every day | ORAL | Status: AC

## 2015-09-01 MED ORDER — METOPROLOL TARTRATE 50 MG PO TABS: ORAL_TABLET | ORAL | Status: DC

## 2015-09-01 MED ORDER — FLUOXETINE HCL 20 MG PO TABS: 20.0000 mg | ORAL_TABLET | Freq: Every day | ORAL | Status: DC

## 2015-09-01 MED ORDER — COLCHICINE 0.6 MG PO TABS: ORAL_TABLET | ORAL | Status: DC

## 2015-09-01 MED ORDER — OMEPRAZOLE 40 MG PO CPDR: 40.0000 mg | DELAYED_RELEASE_CAPSULE | Freq: Every day | ORAL | Status: DC

## 2015-09-01 MED ORDER — LORAZEPAM 0.5 MG PO TABS: 0.5000 mg | ORAL_TABLET | Freq: Two times a day (BID) | ORAL | Status: DC

## 2015-09-01 NOTE — Patient Instructions (Addendum)

## 2015-09-05 ENCOUNTER — Telehealth: Payer: Self-pay

## 2015-09-05 ENCOUNTER — Other Ambulatory Visit: Payer: Self-pay

## 2015-09-05 NOTE — Progress Notes (Signed)
Subjective:    Patient ID: Michael Meza, male    DOB: 01-04-1966, 50 y.o.   MRN: [id number]  HPI Pt is a 50 yo male who presents to the clinic for CPE.   Pt does have some ongoing off and on again gout flares of bilateral great toe. No current flare. Not taking colchine daily but only as needed. It was written for daily.   Pt continues to have a lot of anxiety. Not taking prozac but got insurance so should be able to afford now. Valium does not help anxiety at all. He feels really worked up at night and just can't get anything done. He has no motivation to clean house or anything but work and sleep. No suicidal thoughts.    .. Active Ambulatory Problems    Diagnosis Date Noted  . Tobacco abuse 03/02/2014  . Gout 03/07/2014  . Acute gout 03/07/2014  . Essential hypertension, benign 03/07/2014  . Pain in the chest 03/07/2014  . Right foot pain 06/14/2014  . Anxiety as acute reaction to exceptional stress 08/31/2014  . Depression 08/31/2014   Resolved Ambulatory Problems    Diagnosis Date Noted  . No Resolved Ambulatory Problems   No Additional Past Medical History   .Marland Kitchen. Social History   Social History  . Marital Status: Single    Spouse Name: N/A  . Number of Children: N/A  . Years of Education: N/A   Occupational History  . Not on file.   Social History Main Topics  . Smoking status: Current Every Day Smoker  . Smokeless tobacco: Not on file  . Alcohol Use: Yes  . Drug Use: No  . Sexual Activity: Yes   Other Topics Concern  . Not on file   Social History Narrative      Review of Systems  All other systems reviewed and are negative.      Objective:   Physical Exam BP 138/94 mmHg  Pulse 86  Ht 5\' 11"  (1.803 m)  Wt 187 lb (84.823 kg)  BMI 26.09 kg/m2  General Appearance:    Alert, cooperative, no distress, appears stated age  Head:    Normocephalic, without obvious abnormality, atraumatic  Eyes:    PERRL, conjunctiva/corneas clear, EOM's  intact, fundi    benign, both eyes       Ears:    Normal TM's and external ear canals, both ears  Nose:   Nares normal, septum midline, mucosa normal, no drainage    or sinus tenderness  Throat:   Lips, mucosa, and tongue normal; teeth and gums normal  Neck:   Supple, symmetrical, trachea midline, no adenopathy;       thyroid:  No enlargement/tenderness/nodules; no carotid   bruit or JVD  Back:     Symmetric, no curvature, ROM normal, no CVA tenderness  Lungs:     Clear to auscultation bilaterally, respirations unlabored  Chest wall:    No tenderness or deformity  Heart:    Regular rate and rhythm, S1 and S2 normal, no murmur, rub   or gallop  Abdomen:     Soft, non-tender, bowel sounds active all four quadrants,    no masses, no organomegaly  Genitalia:  Pt declined.   Rectal:  Pt declined.  Extremities:   Extremities normal, atraumatic, no cyanosis or edema  Pulses:   2+ and symmetric all extremities  Skin:   Skin color, texture, turgor normal, no rashes or lesions  Lymph nodes:   Cervical, supraclavicular, and axillary  nodes normal  Neurologic:   CNII-XII intact. Normal strength, sensation and reflexes      throughout         Assessment & Plan:  CPE- AUA was 2. Declined DRE. Will order PSA. Flu shot given. Fasting labs ordered.   Bilateral great toe pain- ordered uric acid to assess gout status. Pt was not taking colchine for prevention but only as needed. No pain or flare today. Will get xray as well today.   Anxiety- stop valium. Start ativan see if helps any better. No more than twice a day. prozac to restart. Declined buspar to try.   HTN- metoprolol refilled, controlled.   GERD- prilosec refilled, controlled.

## 2015-09-05 NOTE — Telephone Encounter (Signed)
Patient aware of both foot xray results.

## 2015-09-05 NOTE — Telephone Encounter (Signed)
-----   Message from Jomarie LongsJade L Breeback, [place] sent at 09/01/2015  4:01 PM EST ----- Call pt: left great toe arthritis.

## 2015-09-09 LAB — COMPLETE METABOLIC PANEL WITH GFR
ALT: 33 U/L (ref 9–46)
AST: 34 U/L (ref 10–35)
Albumin: 4 g/dL (ref 3.6–5.1)
Alkaline Phosphatase: 62 U/L (ref 40–115)
BUN: 6 mg/dL — ABNORMAL LOW (ref 7–25)
CO2: 28 mmol/L (ref 20–31)
Calcium: 9 mg/dL (ref 8.6–10.3)
Chloride: 102 mmol/L (ref 98–110)
Creat: 0.63 mg/dL — ABNORMAL LOW (ref 0.70–1.33)
GFR, Est African American: >89 mL/min (ref >=60)
GFR, Est Non African American: >89 mL/min (ref >=60)
Glucose, Bld: 84 mg/dL (ref 65–99)
Potassium: 4.1 mmol/L (ref 3.5–5.3)
Sodium: 136 mmol/L (ref 135–146)
Total Bilirubin: 0.5 mg/dL (ref 0.2–1.2)
Total Protein: 6.4 g/dL (ref 6.1–8.1)

## 2015-09-09 LAB — LIPID PANEL
Cholesterol: 112 mg/dL — ABNORMAL LOW (ref 125–200)
HDL: 42 mg/dL (ref >=40)
LDL Cholesterol: 53 mg/dL (ref <130)
Total CHOL/HDL Ratio: 2.7 Ratio (ref <=5.0)
Triglycerides: 86 mg/dL (ref <150)
VLDL: 17 mg/dL (ref <30)

## 2015-09-09 LAB — PSA: PSA: 2.22 ng/mL (ref <=4.00)

## 2015-09-09 LAB — URIC ACID: Uric Acid, Serum: 7.6 mg/dL (ref 4.0–7.8)

## 2015-09-13 MED ORDER — COLCHICINE 0.6 MG PO TABS: ORAL_TABLET | ORAL | Status: DC

## 2015-09-13 MED ORDER — ALLOPURINOL 100 MG PO TABS: 100.0000 mg | ORAL_TABLET | Freq: Two times a day (BID) | ORAL | Status: DC

## 2015-09-13 NOTE — Addendum Note (Signed)
Addended by: Jomarie LongsBREEBACK, [name] L on: 09/13/2015 05:10 PM   Modules accepted: Orders

## 2015-10-12 DIAGNOSIS — H25812 Combined forms of age-related cataract, left eye: Secondary | ICD-10-CM | POA: Insufficient documentation

## 2016-03-29 ENCOUNTER — Other Ambulatory Visit: Payer: Self-pay | Admitting: Physician Assistant

## 2016-04-02 ENCOUNTER — Encounter: Payer: Self-pay | Admitting: Physician Assistant

## 2016-04-02 ENCOUNTER — Ambulatory Visit (INDEPENDENT_AMBULATORY_CARE_PROVIDER_SITE_OTHER): Payer: Self-pay | Admitting: Physician Assistant

## 2016-04-02 VITALS — BP 138/82 | HR 67 | Ht 71.0 in | Wt 195.0 lb

## 2016-04-02 DIAGNOSIS — M1000 Idiopathic gout, unspecified site: Secondary | ICD-10-CM

## 2016-04-02 DIAGNOSIS — Z720 Tobacco use: Secondary | ICD-10-CM

## 2016-04-02 DIAGNOSIS — I10 Essential (primary) hypertension: Secondary | ICD-10-CM

## 2016-04-02 DIAGNOSIS — F430 Acute stress reaction: Secondary | ICD-10-CM

## 2016-04-02 DIAGNOSIS — F411 Generalized anxiety disorder: Secondary | ICD-10-CM

## 2016-04-02 DIAGNOSIS — F419 Anxiety disorder, unspecified: Secondary | ICD-10-CM

## 2016-04-02 MED ORDER — LORAZEPAM 0.5 MG PO TABS: 0.5000 mg | ORAL_TABLET | Freq: Two times a day (BID) | ORAL | 2 refills | Status: DC

## 2016-04-02 MED ORDER — METOPROLOL TARTRATE 50 MG PO TABS: ORAL_TABLET | ORAL | 5 refills | Status: DC

## 2016-04-02 MED ORDER — ALLOPURINOL 100 MG PO TABS: 100.0000 mg | ORAL_TABLET | Freq: Two times a day (BID) | ORAL | 5 refills | Status: DC

## 2016-04-02 MED ORDER — COLCHICINE 0.6 MG PO TABS: ORAL_TABLET | ORAL | 5 refills | Status: DC

## 2016-04-02 MED ORDER — OMEPRAZOLE 40 MG PO CPDR: 40.0000 mg | DELAYED_RELEASE_CAPSULE | Freq: Every day | ORAL | 5 refills | Status: DC

## 2016-04-02 NOTE — Patient Instructions (Signed)

## 2016-04-03 NOTE — Progress Notes (Signed)
   Subjective:    Patient ID: Michael Meza, male    DOB: 07-19-1966, 50 y.o.   MRN: [id number]  HPI Patient is a 50 year old male who presents to the clinic with a history of gout who comes in to have refills on his medications. 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 He has been out of his allopurinol and colchicine. She restarted him on caution thing. He has not restarted allopurinol. Got was in his right foot. It has improved significantly.  He does need medication refills on his other meds. He currently does not have insurance and requests to get 6 month supply and hopefully he gets insurance and can have more screening labs done.   Review of Systems  All other systems reviewed and are negative.      Objective:   Physical Exam  Constitutional: He is oriented to person, place, and time. He appears well-developed and well-nourished.  HENT:  Head: Normocephalic and atraumatic.  Right Ear: External ear normal.  Left Ear: External ear normal.  Pulmonary/Chest: Effort normal and breath sounds normal.  Neurological: He is alert and oriented to person, place, and time.  Skin: Skin is dry.  Psychiatric: He has a normal mood and affect. His behavior is normal.          Assessment & Plan:  Gout- restart allopurinol 100mg  bid. colchine qd for next 3-6 months. Strongly encouraged patient to stop smoking and stop drinking alcohol.  HTN- refilled metoprolol. Recheck blood pressure and improve significantly from first check.  GERD- refilled omeprazole.   GAD- as needed ativan. Pt has tried preventative SSRI and SSNRI in past and stated did not work.   Patient does not have insurance and request not to do any labs today. When he does get insurance he will come in for complete physical.

## 2016-06-24 ENCOUNTER — Other Ambulatory Visit: Payer: Self-pay | Admitting: *Deleted

## 2016-06-24 MED ORDER — ALLOPURINOL 100 MG PO TABS: 100.0000 mg | ORAL_TABLET | Freq: Two times a day (BID) | ORAL | 0 refills | Status: DC

## 2016-06-24 NOTE — Progress Notes (Signed)
Request for 90 day  supply

## 2016-08-10 ENCOUNTER — Other Ambulatory Visit: Payer: Self-pay | Admitting: Physician Assistant

## 2016-08-28 ENCOUNTER — Telehealth: Payer: Self-pay | Admitting: Physician Assistant

## 2016-08-28 NOTE — Telephone Encounter (Signed)
Pt called in requesting a refill on his reflux medication. He stated he has been sick to his stomach and is sure he has an ulcer. He is also interested in a prescription for Fish Oil. Thanks!

## 2016-08-29 NOTE — Telephone Encounter (Signed)
Spoke with pt about his meds.  Omeprazole was already sent to pharmacy but is too expensive without ins so I advised him to just do OTC for now.

## 2016-10-29 ENCOUNTER — Encounter: Payer: Self-pay | Admitting: Physician Assistant

## 2016-10-30 ENCOUNTER — Ambulatory Visit (INDEPENDENT_AMBULATORY_CARE_PROVIDER_SITE_OTHER): Payer: BLUE CROSS/BLUE SHIELD | Admitting: Physician Assistant

## 2016-10-30 ENCOUNTER — Encounter: Payer: Self-pay | Admitting: Physician Assistant

## 2016-10-30 VITALS — BP 135/73 | HR 78 | Ht 71.0 in | Wt 188.0 lb

## 2016-10-30 DIAGNOSIS — F1010 Alcohol abuse, uncomplicated: Secondary | ICD-10-CM | POA: Diagnosis not present

## 2016-10-30 DIAGNOSIS — Z131 Encounter for screening for diabetes mellitus: Secondary | ICD-10-CM

## 2016-10-30 DIAGNOSIS — F419 Anxiety disorder, unspecified: Secondary | ICD-10-CM

## 2016-10-30 DIAGNOSIS — Z0000 Encounter for general adult medical examination without abnormal findings: Secondary | ICD-10-CM | POA: Diagnosis not present

## 2016-10-30 DIAGNOSIS — I10 Essential (primary) hypertension: Secondary | ICD-10-CM

## 2016-10-30 DIAGNOSIS — Z720 Tobacco use: Secondary | ICD-10-CM | POA: Diagnosis not present

## 2016-10-30 DIAGNOSIS — M1A00X0 Idiopathic chronic gout, unspecified site, without tophus (tophi): Secondary | ICD-10-CM

## 2016-10-30 DIAGNOSIS — Z13220 Encounter for screening for lipoid disorders: Secondary | ICD-10-CM | POA: Diagnosis not present

## 2016-10-30 HISTORY — DX: Alcohol abuse, uncomplicated: F10.10

## 2016-10-30 MED ORDER — METOPROLOL TARTRATE 50 MG PO TABS: ORAL_TABLET | ORAL | 5 refills | Status: DC

## 2016-10-30 MED ORDER — COLCHICINE 0.6 MG PO TABS: ORAL_TABLET | ORAL | 5 refills | Status: DC

## 2016-10-30 NOTE — Patient Instructions (Signed)

## 2016-10-30 NOTE — Progress Notes (Addendum)
Subjective:     Patient ID: Michael Meza, male   DOB: 1966/09/01, 51 y.o.   MRN: [id number]  HPI  Patient states he has been smoking at least 2 packs per day. Patient is interested in stopping smoking. 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 He also states he "binge drinks" because "I like to party and never grew out of it." He reports drinking 6-24 beers on the weekends until he passes out. 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 Stopped drinking for 2 months but has since restarted. Reports history of DUI. States this has discord with her daughter and believes it is leading to his increased anxiety.   Takes Lorazepam once daily for anxiety but states this does nothing for him. Requests xanax. States he has previously tried Prozac and experienced insomnia and nightmares which has made him afraid of all SSRIs.   States his stomach and throat have been hurting recently. Endorses acid reflux and states he needs to buy Prilosec and begin taking it again  States he sometimes chokes on tootsie rolls when he swallows and has to cough it up but this only occurs once every 3 months.   Has not had gout flare in several months. States he is running low on colchicene.  Active Ambulatory Problems    Diagnosis Date Noted  . Tobacco abuse 03/02/2014  . Gout 03/07/2014  . Acute gout 03/07/2014  . Essential hypertension, benign 03/07/2014  . Pain in the chest 03/07/2014  . Right foot pain 06/14/2014  . Anxiety as acute reaction to exceptional stress 08/31/2014  . Depression 08/31/2014   Resolved Ambulatory Problems    Diagnosis Date Noted  . No Resolved Ambulatory Problems   No Additional Past Medical History    Current Outpatient Prescriptions:  .  colchicine 0.6 MG tablet, Take one tablet daily., Disp: 30 tablet, Rfl: 5 .  metoprolol (LOPRESSOR) 50 MG tablet, TAKE 1 TABLET (50 MG TOTAL) BY MOUTH 2 (TWO) TIMES DAILY., Disp: 60 tablet, Rfl: 5 .   omeprazole (PRILOSEC) 40 MG capsule, TAKE 1 CAPSULE (40 MG TOTAL) BY MOUTH DAILY., Disp: 30 capsule, Rfl: 5  Social History   Social History  . Marital status: Single    Spouse name: N/A  . Number of children: N/A  . Years of education: N/A   Occupational History  . Not on file.   Social History Main Topics  . Smoking status: Current Every Day Smoker  . Smokeless tobacco: Never Used  . Alcohol use Yes  . Drug use: No  . Sexual activity: Yes   Other Topics Concern  . Not on file   Social History Narrative  . No narrative on file   IPSS Questionnaire (AUA-7): Over the past month.   1)  How often have you had a sensation of not emptying your bladder completely after you finish urinating?  0 - Not at all  2)  How often have you had to urinate again less than two hours after you finished urinating? 0 - Not at all  3)  How often have you found you stopped and started again several times when you urinated?  0 - Not at all  4) How difficult have you found it to postpone urination?  1 - Less than 1 time in 5  5) How often have you had a weak urinary stream?  0 - Not at all  6) How often have you had  to push or strain to begin urination?  0 - Not at all  7) How many times did you most typically get up to urinate from the time you went to bed until the time you got up in the morning?  0 - None  Total score: 1 0-7 mildly symptomatic   8-19 moderately symptomatic   20-35 severely symptomatic    Review of Systems All other ROS negative except those noted in the HPI.    Objective:   Physical Exam  Constitutional: He is oriented to person, place, and time. He appears well-developed and well-nourished. No distress.  HENT:  Head: Normocephalic and atraumatic.  Right Ear: External ear normal.  Left Ear: External ear normal.  Nose: Nose normal.  Mouth/Throat: Uvula is midline and oropharynx is clear and moist. No oral lesions. No oropharyngeal exudate.  Eyes: Conjunctivae and EOM are  normal. Pupils are equal, round, and reactive to light. Right eye exhibits no discharge. Left eye exhibits no discharge. No scleral icterus.  Neck: Normal range of motion. Neck supple. No JVD present. No tracheal deviation present. No thyromegaly present.  Cardiovascular: Normal rate and regular rhythm.  Exam reveals no gallop and no friction rub.   No murmur heard. Pulmonary/Chest: Effort normal and breath sounds normal. He has no wheezes. He has no rales.  Abdominal: Soft. Bowel sounds are normal. He exhibits no distension and no mass. There is no tenderness. There is no rebound and no guarding.  Genitourinary:  Genitourinary Comments: Deferred.  Musculoskeletal: Normal range of motion. He exhibits no edema, tenderness or deformity.       Right shoulder: He exhibits normal strength.  Lymphadenopathy:    He has no cervical adenopathy.  Neurological: He is alert and oriented to person, place, and time. He has normal strength and normal reflexes. He displays no tremor. He exhibits normal muscle tone.  Skin: Skin is warm and dry. No rash noted. No erythema.     Psychiatric: He has a normal mood and affect. His behavior is normal. Thought content normal.    Blood pressure 135/73, pulse 78, height 5\' 11"  (1.803 m), weight 188 lb (85.3 kg).     Assessment/Plan:  Michael Meza was seen today for annual exam.  Diagnoses and all orders for this visit:  Routine physical examination -     CBC with Differential -     PSA  Screening for diabetes mellitus -     COMPLETE METABOLIC PANEL WITH GFR  Alcohol abuse -     Lipase  Screening for lipid disorders -     Lipid Profile  Idiopathic chronic gout without tophus, unspecified site -     colchicine 0.6 MG tablet; Take one tablet daily.  Essential hypertension -     metoprolol (LOPRESSOR) 50 MG tablet; TAKE 1 TABLET (50 MG TOTAL) BY MOUTH 2 (TWO) TIMES DAILY.  - Counseled patient on importance of stopping drinking. Encouraged him to attend AA  meetings, get involved in church support groups, and to work on his relationship with his daughter who is a Education officer, environmentalastor. States he is unsure if he would attend any meetings and was determined to quit on his own. Will check lipase given history of pancreatic damage as well as LFT's to assess liver function. - Discussed Nicotine gums and patches. Do not want to start Wellbutrin for tobacco dependence given he is anxious at baseline and this may exacerbate symptoms. He also is hesitant to start "crazy meds" given previous reaction to Prozac. -  Patient requested xanax for anxiety since Ativan is not working. Educated him on dangers of benzodiazepines especially with his alcohol use disorder. Discontinued current prescription for Ativan. Declined trying SSRI given previous reaction to Prozac. Discussed some of his anxiety is likely due to alcohol withdrawal and may improve once he stops drinking. - Screening labs ordered and will call patient with results.  - Colchicine refilled. Educated patient that if he continues to drink he may experience flares of gout. - AUA score of 1 today. PSA ordered for screening. Discussed if PSA is abnormal patient will return for DRE and further workup.   30 minutes was spent with the patient and over 50% of the time was spent counseling patient on addiction and medical complications of tobacco and alcohol use disorder.

## 2016-10-31 LAB — COMPLETE METABOLIC PANEL WITH GFR
ALT: 26 U/L (ref 9–46)
AST: 48 U/L — ABNORMAL HIGH (ref 10–35)
Albumin: 4.1 g/dL (ref 3.6–5.1)
Alkaline Phosphatase: 67 U/L (ref 40–115)
BUN: 6 mg/dL — ABNORMAL LOW (ref 7–25)
CO2: 22 mmol/L (ref 20–31)
Calcium: 9 mg/dL (ref 8.6–10.3)
Chloride: 104 mmol/L (ref 98–110)
Creat: 0.7 mg/dL (ref 0.70–1.33)
GFR, Est African American: >89 mL/min (ref >=60)
GFR, Est Non African American: >89 mL/min (ref >=60)
Glucose, Bld: 91 mg/dL (ref 65–99)
Potassium: 4.2 mmol/L (ref 3.5–5.3)
Sodium: 136 mmol/L (ref 135–146)
Total Bilirubin: 0.5 mg/dL (ref 0.2–1.2)
Total Protein: 6.6 g/dL (ref 6.1–8.1)

## 2016-10-31 LAB — CBC WITH DIFFERENTIAL/PLATELET
Basophils Absolute: 0 cells/uL (ref 0–200)
Basophils Relative: 0 %
Eosinophils Absolute: 66 cells/uL (ref 15–500)
Eosinophils Relative: 1 %
HCT: 51 % — ABNORMAL HIGH (ref 38.5–50.0)
Hemoglobin: 17.4 g/dL — ABNORMAL HIGH (ref 13.2–17.1)
Lymphocytes Relative: 29 %
Lymphs Abs: 1914 cells/uL (ref 850–3900)
MCH: 33.3 pg — ABNORMAL HIGH (ref 27.0–33.0)
MCHC: 34.1 g/dL (ref 32.0–36.0)
MCV: 97.7 fL (ref 80.0–100.0)
MPV: 9.9 fL (ref 7.5–12.5)
Monocytes Absolute: 726 cells/uL (ref 200–950)
Monocytes Relative: 11 %
Neutro Abs: 3894 cells/uL (ref 1500–7800)
Neutrophils Relative %: 59 %
Platelets: 260 10*3/uL (ref 140–400)
RBC: 5.22 MIL/uL (ref 4.20–5.80)
RDW: 13.7 % (ref 11.0–15.0)
WBC: 6.6 10*3/uL (ref 3.8–10.8)

## 2016-10-31 LAB — LIPID PANEL
Cholesterol: 126 mg/dL (ref <200)
HDL: 70 mg/dL (ref >40)
LDL Cholesterol: 43 mg/dL (ref <100)
Total CHOL/HDL Ratio: 1.8 Ratio (ref <5.0)
Triglycerides: 64 mg/dL (ref <150)
VLDL: 13 mg/dL (ref <30)

## 2016-10-31 LAB — PSA: PSA: 0.5 ng/mL (ref <=4.0)

## 2016-10-31 LAB — LIPASE: Lipase: 51 U/L (ref 7–60)

## 2016-11-16 ENCOUNTER — Other Ambulatory Visit: Payer: Self-pay | Admitting: Physician Assistant

## 2016-12-30 ENCOUNTER — Ambulatory Visit: Payer: BLUE CROSS/BLUE SHIELD | Admitting: Physician Assistant

## 2017-02-25 ENCOUNTER — Encounter: Payer: BLUE CROSS/BLUE SHIELD | Admitting: Family Medicine

## 2017-02-25 ENCOUNTER — Encounter: Payer: Self-pay | Admitting: Family Medicine

## 2017-02-25 ENCOUNTER — Ambulatory Visit (INDEPENDENT_AMBULATORY_CARE_PROVIDER_SITE_OTHER): Payer: BLUE CROSS/BLUE SHIELD | Admitting: Family Medicine

## 2017-02-25 VITALS — BP 134/75 | HR 73 | Wt 186.0 lb

## 2017-02-25 DIAGNOSIS — M7711 Lateral epicondylitis, right elbow: Secondary | ICD-10-CM

## 2017-02-25 DIAGNOSIS — S39012A Strain of muscle, fascia and tendon of lower back, initial encounter: Secondary | ICD-10-CM

## 2017-02-25 DIAGNOSIS — M1712 Unilateral primary osteoarthritis, left knee: Secondary | ICD-10-CM | POA: Diagnosis not present

## 2017-02-25 MED ORDER — DICLOFENAC SODIUM 1 % TD GEL: 4.0000 g | Freq: Four times a day (QID) | TRANSDERMAL | 11 refills | Status: DC

## 2017-02-25 MED ORDER — CYCLOBENZAPRINE HCL 10 MG PO TABS: 10.0000 mg | ORAL_TABLET | Freq: Three times a day (TID) | ORAL | 0 refills | Status: DC | PRN

## 2017-02-25 NOTE — Progress Notes (Signed)
Michael Meza is a 51 y.o. male who presents to [facility] today for right elbow pain and chronic back pain.  Patient has a three-day history of right elbow pain. He denies any particular injury but notes the pain is worse when he pushes her pupils are heavy object. He denies any radiating pain weakness or numbness. The pain is located in the lateral elbow is worse with grip. He's tried some over-the-counter ibuprofen which helps a bit.  Back pain: Patient has a history of chronic back pain. This is been ongoing for several years. Pain is located in the lower back along the midline and is worse at bedtime. The pain will occasionally wake him from sleep. He denies any radiating pain weakness or numbness. He has tried over-the-counter medications which have helped a bit.   Past Medical History:  Diagnosis Date  . Alcohol abuse 10/30/2016  . Depression 08/31/2014  . Essential hypertension, benign 03/07/2014  . Gout 03/07/2014   Not currently on treatment for prevention.     No past surgical history on file. Social History  Substance Use Topics  . Smoking status: Current Every Day Smoker  . Smokeless tobacco: Never Used  . Alcohol use Yes     ROS:  As above   Medications: Current Outpatient Prescriptions  Medication Sig Dispense Refill  . colchicine 0.6 MG tablet Take one tablet daily. 30 tablet 5  . metoprolol (LOPRESSOR) 50 MG tablet TAKE 1 TABLET (50 MG TOTAL) BY MOUTH 2 (TWO) TIMES DAILY. 60 tablet 5  . omeprazole (PRILOSEC) 40 MG capsule TAKE 1 CAPSULE (40 MG TOTAL) BY MOUTH DAILY. 30 capsule 5  . cyclobenzaprine (FLEXERIL) 10 MG tablet Take 1 tablet (10 mg total) by mouth 3 (three) times daily as needed for muscle spasms. 30 tablet 0  . diclofenac sodium (VOLTAREN) 1 % GEL Apply 4 g topically 4 (four) times daily. To affected joint. 100 g 11   No current facility-administered medications for this visit.    Allergies  Allergen  Reactions  . 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   . Prozac [Fluoxetine Hcl]     insomnia     Exam:  BP 134/75   Pulse 73   Wt 186 lb (84.4 kg)   BMI 25.94 kg/m  General: Well Developed, well nourished, and in no acute distress.  Neuro/Psych: Alert and oriented x3, extra-ocular muscles intact, able to move all 4 extremities, sensation grossly intact. Skin: Warm and dry, no rashes noted.  Respiratory: Not using accessory muscles, speaking in full sentences, trachea midline.  Cardiovascular: Pulses palpable, no extremity edema. Abdomen: Does not appear distended. MSK:  Right elbow normal appearing with normal motion. Tender palpation overlying the lateral epicondyle. Pain with resisted wrist extension. Normal elbow strength.  L-spine normal-appearing nontender to midline. Tender palpation bilateral lumbar paraspinal muscles. Normal back motion. Normal lower shoulder strength and reflexes and sensation.   X-ray L-spine pending      Assessment and Plan: 51 y.o. male with right elbow pain is lateral epicondylitis. Discussed options. Plan for treatment with diclofenac gel home exercise program and wrist brace at work as needed. Recheck in a few weeks. Additionally refer to physical therapy.  Lumbar pain: Likely degeneration present. Myofascial disruption is certainly a possibility. Refer to physical therapy. Flexeril at bedtime as needed for symptoms.  Work note provided.  Voltaren gel also be use for history of knee arthritis.  Orders Placed This Encounter  Procedures  . DG Lumbar Spine Complete    Standing Status:   Future    Standing Expiration Date:   04/27/2018    Order Specific Question:   Reason for Exam (SYMPTOM  OR DIAGNOSIS REQUIRED)    Answer:   eval lspine pain    Order Specific Question:   [facility]    [facility]    Order Specific Question:   Radiology Contrast Protocol - do NOT remove file  path    Answer:   [email or web address]  . Ambulatory referral to Physical Therapy    Referral Priority:   Routine    Referral Type:   Physical Medicine    Referral Reason:   Specialty Services Required    Requested Specialty:   Physical Therapy    Number of Visits Requested:   1   Meds ordered this encounter  Medications  . DISCONTD: diclofenac sodium (VOLTAREN) 1 % GEL    Sig: Apply 4 g topically 4 (four) times daily. To affected joint.    Dispense:  100 g    Refill:  11  . DISCONTD: cyclobenzaprine (FLEXERIL) 10 MG tablet    Sig: Take 1 tablet (10 mg total) by mouth 3 (three) times daily as needed for muscle spasms.    Dispense:  30 tablet    Refill:  0  . diclofenac sodium (VOLTAREN) 1 % GEL    Sig: Apply 4 g topically 4 (four) times daily. To affected joint.    Dispense:  100 g    Refill:  11  . cyclobenzaprine (FLEXERIL) 10 MG tablet    Sig: Take 1 tablet (10 mg total) by mouth 3 (three) times daily as needed for muscle spasms.    Dispense:  30 tablet    Refill:  0    Discussed warning signs or symptoms. Please see discharge instructions. Patient expresses understanding.

## 2017-02-25 NOTE — Patient Instructions (Signed)
Thank you for coming in today. Get the xray today.  Attend PT.  Work on the wrist stretch and the slow wrist motion.  Recheck in 4 weeks.    Tennis Elbow Tennis elbow (lateral epicondylitis) is inflammation of the outer tendons of your forearm close to your elbow. Your tendons attach your muscles to your bones. The outer tendons of your forearm are used to extend your wrist, and they attach on the outside part of your elbow. Tennis elbow is often found in people who play tennis, but anyone may get the condition from repeatedly extending the wrist or turning the forearm. What are the causes? This condition is caused by repeatedly extending your wrist and using your hands. It can result from sports or work that requires repetitive forearm movements. Tennis elbow may also be caused by an injury. What increases the risk? You have a higher risk of developing tennis elbow if you play tennis or another racquet sport. You also have a higher risk if you frequently use your hands for work. This condition is also more likely to develop in:  Musicians.  Carpenters, painters, and plumbers.  Cooks.  Cashiers.  People who work in [employer].  Holiday representative workers.  Butchers.  People who use computers.  What are the signs or symptoms? Symptoms of this condition include:  Pain and tenderness in your forearm and the outer part of your elbow. You may only feel the pain when you use your arm, or you may feel it even when you are not using your arm.  A burning feeling that runs from your elbow through your arm.  Weak grip in your hands.  How is this diagnosed? This condition may be diagnosed by medical history and physical exam. You may also have other tests, including:  X-rays.  MRI.  How is this treated? Your health care provider will recommend lifestyle adjustments, such as resting and icing your arm. Treatment may also include:  Medicines for inflammation. This may include shots of  cortisone if your pain continues.  Physical therapy. This may include massage or exercises.  An elbow brace.  Surgery may eventually be recommended if your pain does not go away with treatment. Follow these instructions at home: Activity  Rest your elbow and wrist as directed by your health care provider. Try to avoid any activities that caused the problem until your health care provider says that you can do them again.  If a physical therapist teaches you exercises, do all of them as directed.  If you lift an object, lift it with your palm facing upward. This lowers the stress on your elbow. Lifestyle  If your tennis elbow is caused by sports, check your equipment and make sure that: ? You are using it correctly. ? It is the best fit for you.  If your tennis elbow is caused by work, take breaks frequently, if you are able. Talk with your manager about how to best perform tasks in a way that is safe. ? If your tennis elbow is caused by computer use, talk with your manager about any changes that can be made to your work environment. General instructions  If directed, apply ice to the painful area: ? Put ice in a plastic bag. ? Place a towel between your skin and the bag. ? Leave the ice on for 20 minutes, 2-3 times per day.  Take medicines only as directed by your health care provider.  If you were given a brace, wear it as  directed by your health care provider.  Keep all follow-up visits as directed by your health care provider. This is important. Contact a health care provider if:  Your pain does not get better with treatment.  Your pain gets worse.  You have numbness or weakness in your forearm, hand, or fingers. This information is not intended to replace advice given to you by your health care provider. Make sure you discuss any questions you have with your health care provider. Document Released: 08/19/2005 Document Revised: 04/18/2016 Document Reviewed:  08/15/2014 Elsevier Interactive Patient Education  [employer]2018 Elsevier Inc.

## 2017-02-26 ENCOUNTER — Ambulatory Visit (INDEPENDENT_AMBULATORY_CARE_PROVIDER_SITE_OTHER): Payer: BLUE CROSS/BLUE SHIELD

## 2017-02-26 DIAGNOSIS — M545 Low back pain: Secondary | ICD-10-CM

## 2017-02-26 DIAGNOSIS — G8929 Other chronic pain: Secondary | ICD-10-CM | POA: Diagnosis not present

## 2017-02-26 DIAGNOSIS — S39012A Strain of muscle, fascia and tendon of lower back, initial encounter: Secondary | ICD-10-CM

## 2017-02-26 DIAGNOSIS — I700 Atherosclerosis of aorta: Secondary | ICD-10-CM

## 2017-03-14 ENCOUNTER — Ambulatory Visit: Payer: BLUE CROSS/BLUE SHIELD | Admitting: Rehabilitative and Restorative Service Providers"

## 2017-03-24 ENCOUNTER — Ambulatory Visit (INDEPENDENT_AMBULATORY_CARE_PROVIDER_SITE_OTHER): Payer: BLUE CROSS/BLUE SHIELD

## 2017-03-24 ENCOUNTER — Ambulatory Visit (INDEPENDENT_AMBULATORY_CARE_PROVIDER_SITE_OTHER): Payer: BLUE CROSS/BLUE SHIELD | Admitting: Family Medicine

## 2017-03-24 ENCOUNTER — Encounter: Payer: Self-pay | Admitting: Family Medicine

## 2017-03-24 VITALS — BP 134/97 | HR 94 | Wt 186.0 lb

## 2017-03-24 DIAGNOSIS — J90 Pleural effusion, not elsewhere classified: Secondary | ICD-10-CM

## 2017-03-24 DIAGNOSIS — S39012A Strain of muscle, fascia and tendon of lower back, initial encounter: Secondary | ICD-10-CM

## 2017-03-24 DIAGNOSIS — R042 Hemoptysis: Secondary | ICD-10-CM | POA: Diagnosis not present

## 2017-03-24 MED ORDER — VARENICLINE TARTRATE 0.5 MG X 11 & 1 MG X 42 PO MISC: ORAL | 0 refills | Status: DC

## 2017-03-24 MED ORDER — CYCLOBENZAPRINE HCL 10 MG PO TABS: 10.0000 mg | ORAL_TABLET | Freq: Three times a day (TID) | ORAL | 0 refills | Status: DC | PRN

## 2017-03-24 MED ORDER — LEVOFLOXACIN 500 MG PO TABS: 500.0000 mg | ORAL_TABLET | Freq: Every day | ORAL | 0 refills | Status: DC

## 2017-03-24 NOTE — Patient Instructions (Signed)
Thank you for coming in today. Get an xray today.  Take chantix for smoking cessation.   Varenicline oral tablets What is this medicine? VARENICLINE (var EN i kleen) is used to help people quit smoking. It can reduce the symptoms caused by stopping smoking. It is used with a patient support program recommended by your physician. This medicine may be used for other purposes; ask your health care provider or pharmacist if you have questions. COMMON BRAND NAME(S): Chantix What should I tell my health care provider before I take this medicine? They need to know if you have any of these conditions: -bipolar disorder, depression, schizophrenia or other mental illness -heart disease -if you often drink alcohol -kidney disease -peripheral vascular disease -seizures -stroke -suicidal thoughts, plans, or attempt; a previous suicide attempt by you or a family member -an unusual or allergic reaction to varenicline, other medicines, foods, dyes, or preservatives -pregnant or trying to get pregnant -breast-feeding How should I use this medicine? Take this medicine by mouth after eating. Take with a full glass of water. Follow the directions on the prescription label. Take your doses at regular intervals. Do not take your medicine more often than directed. There are 3 ways you can use this medicine to help you quit smoking; talk to your health care professional to decide which plan is right for you: 1) you can choose a quit date and start this medicine 1 week before the quit date, or, 2) you can start taking this medicine before you choose a quit date, and then pick a quit date between day 8 and 35 days of treatment, or, 3) if you are not sure that you are able or willing to quit smoking right away, start taking this medicine and slowly decrease the amount you smoke as directed by your health care professional with the goal of being cigarette-free by week 12 of treatment. Stick to your plan; ask about  support groups or other ways to help you remain cigarette-free. If you are motivated to quit smoking and did not succeed during a previous attempt with this medicine for reasons other than side effects, or if you returned to smoking after this treatment, speak with your health care professional about whether another course of this medicine may be right for you. A special MedGuide will be given to you by the pharmacist with each prescription and refill. Be sure to read this information carefully each time. Talk to your pediatrician regarding the use of this medicine in children. This medicine is not approved for use in children. Overdosage: If you think you have taken too much of this medicine contact a poison control center or emergency room at once. NOTE: This medicine is only for you. Do not share this medicine with others. What if I miss a dose? If you miss a dose, take it as soon as you can. If it is almost time for your next dose, take only that dose. Do not take double or extra doses. What may interact with this medicine? -alcohol or any product that contains alcohol -insulin -other stop smoking aids -theophylline -warfarin This list may not describe all possible interactions. Give your health care provider a list of all the medicines, herbs, non-prescription drugs, or dietary supplements you use. Also tell them if you smoke, drink alcohol, or use illegal drugs. Some items may interact with your medicine. What should I watch for while using this medicine? Visit your doctor or health care professional for regular check ups. Ask for  ongoing advice and encouragement from your doctor or healthcare professional, friends, and family to help you quit. If you smoke while on this medication, quit again Your mouth may get dry. Chewing sugarless gum or sucking hard candy, and drinking plenty of water may help. Contact your doctor if the problem does not go away or is severe. You may get drowsy or dizzy.  Do not drive, use machinery, or do anything that needs mental alertness until you know how this medicine affects you. Do not stand or sit up quickly, especially if you are an older patient. This reduces the risk of dizzy or fainting spells. Sleepwalking can happen during treatment with this medicine, and can sometimes lead to behavior that is harmful to you, other people, or property. Stop taking this medicine and tell your doctor if you start sleepwalking or have other unusual sleep-related activity. Decrease the amount of alcoholic beverages that you drink during treatment with this medicine until you know if this medicine affects your ability to tolerate alcohol. Some people have experienced increased drunkenness (intoxication), unusual or sometimes aggressive behavior, or no memory of things that have happened (amnesia) during treatment with this medicine. The use of this medicine may increase the chance of suicidal thoughts or actions. Pay special attention to how you are responding while on this medicine. Any worsening of mood, or thoughts of suicide or dying should be reported to your health care professional right away. What side effects may I notice from receiving this medicine? Side effects that you should report to your doctor or health care professional as soon as possible: -allergic reactions like skin rash, itching or hives, swelling of the face, lips, tongue, or throat -acting aggressive, being angry or violent, or acting on dangerous impulses -breathing problems -changes in vision -chest pain or chest tightness -confusion, trouble speaking or understanding -new or worsening depression, anxiety, or panic attacks -extreme increase in activity and talking (mania) -fast, irregular heartbeat -feeling faint or lightheaded, falls -fever -pain in legs when walking -problems with balance, talking, walking -redness, blistering, peeling or loosening of the skin, including inside the  mouth -ringing in ears -seeing or hearing things that aren't there (hallucinations) -seizures -sleepwalking -sudden numbness or weakness of the face, arm or leg -thoughts about suicide or dying, or attempts to commit suicide -trouble passing urine or change in the amount of urine -unusual bleeding or bruising -unusually weak or tired Side effects that usually do not require medical attention (report to your doctor or health care professional if they continue or are bothersome): -constipation -headache -nausea, vomiting -strange dreams -stomach gas -trouble sleeping This list may not describe all possible side effects. Call your doctor for medical advice about side effects. You may report side effects to FDA at 1-800-FDA-1088. Where should I keep my medicine? Keep out of the reach of children. Store at room temperature between 15 and 30 degrees C (59 and 86 degrees F). Throw away any unused medicine after the expiration date. NOTE: This sheet is a summary. It may not cover all possible information. If you have questions about this medicine, talk to your doctor, pharmacist, or health care provider.  2018 Elsevier/Gold Standard (2015-05-04 16:14:23)

## 2017-03-24 NOTE — Progress Notes (Signed)
Michael DeutscherJeffrey L Starkes is a 51 y.o. male who [facility] today for follow-up of lateral epicondylitis and back pain.  Lateral epicondylitis: Patient reports that pain has improved. He has been wearing a wrist brace to help alleviate some of the pressure when working.   Back pain: Patient reports that the back pain has improved. Flexeril has helped relieve the pain. He is now able to sleep without waking from the pain. Patient missed PT appointment last week and would like to try to attend once every 2 weeks.   Hemoptysis: Patient states that approximately 2 weeks ago he had hemoptysis for 3 days. He states that he also had a bad cough during this time and thought he had pneumonia. He denies any fever, chills, night sweats, or recent weight loss. He endorses a significant smoking history and is currently smoking 1-2 packs per day. Patient reports being scared by blood in sputum and is encouraged to quit smoking.   Past Medical History:  Diagnosis Date  . Alcohol abuse 10/30/2016  . Depression 08/31/2014  . Essential hypertension, benign 03/07/2014  . Gout 03/07/2014   Not currently on treatment for prevention.     No past surgical history on file. Social History  Substance Use Topics  . Smoking status: Current Every Day Smoker  . Smokeless tobacco: Never Used  . Alcohol use Yes     ROS:  As above   Medications: Current Outpatient Prescriptions  Medication Sig Dispense Refill  . colchicine 0.6 MG tablet Take one tablet daily. 30 tablet 5  . cyclobenzaprine (FLEXERIL) 10 MG tablet Take 1 tablet (10 mg total) by mouth 3 (three) times daily as needed for muscle spasms. 30 tablet 0  . diclofenac sodium (VOLTAREN) 1 % GEL APP 4 GRAMS TO AFFECTED JOINT QID  11  . metoprolol (LOPRESSOR) 50 MG tablet TAKE 1 TABLET (50 MG TOTAL) BY MOUTH 2 (TWO) TIMES DAILY. 60 tablet 5  . levofloxacin (LEVAQUIN) 500 MG tablet Take 1 tablet (500 mg total) by  mouth daily. 7 tablet 0  . varenicline (CHANTIX STARTING MONTH PAK) 0.5 MG X 11 & 1 MG X 42 tablet Take one 0.5mg  tablet by mouth once daily for 3 days, then increase to one 0.5mg  tablet twice daily for 3 days, then increase to one 1mg  tablet twice daily. 53 tablet 0   No current facility-administered medications for this visit.    Allergies  Allergen Reactions  . Prozac [Fluoxetine Hcl]     insomnia     Exam:  BP (!) 134/97   Pulse 94   Wt 186 lb (84.4 kg)   SpO2 98%   BMI 25.94 kg/m  General: Well Developed, well nourished, and in no acute distress.  Neuro/Psych: Alert and oriented x3, extra-ocular muscles intact, able to move all 4 extremities, sensation grossly intact. Skin: Warm and dry, no rashes noted.  Respiratory: Not using accessory muscles, speaking in full sentences, trachea midline.  Cardiovascular: Pulses palpable, no extremity edema. Abdomen: Does not appear distended. MSK:  Right elbow: No swelling or gross deformity on inspection Not tender to palpation ROM is 0-120 degrees on flexion and extension, normal ROM with pronation and supination, abduction elicits pain at lateral epicondyle, no pain with adduction Strength is 5/5 in abduction, adduction, flexion and extension    Lumbar spine: No gross deformity on inspection Not tender to palpation Normal range of motion with flexion and extension, lateral extension equal on L and R  No results found for this or any previous visit (from the past 48 hour(s)). Dg Chest 2 View  Result Date: 03/24/2017 CLINICAL DATA:  Hemoptysis for several days, history of smoking EXAM: CHEST  2 VIEW COMPARISON:  None. FINDINGS: Cardiac shadow is within normal limits. The left lung is clear. Small right pleural effusion is noted. There is likely some underlying atelectasis although no focal confluent infiltrate is seen. No bony abnormality is noted. IMPRESSION: Small right pleural effusion with likely underlying atelectasis.  Electronically Signed   By: Alcide Clever M.D.   On: 03/24/2017 15:43      Assessment and Plan: 51 y.o. male with lateral epicondylitis, back pain, and hemoptysis. Patient is doing a good job relieving pressure from lateral epicondyle. He will buy a counter force brace for lateral epicondylitis and see if this also helps take some of the pressure off this area.   Patient reports that back pain is improving. He will plan to follow-up with PT on April 07, 2017. Patient plans to attend PT once every 2 weeks. Patient was counseled on the importance of performing exercises outside of PT.   Patient reports hemoptysis of 3 days. Given patient's significant smoking history, a chest x-ray was ordered to further assess etiology of hemoptysis. The chest x-ray showed a small right pleural effusion with likely underlying atelectasis. A CT-scan was ordered to further evaluate. The benefits of quitting smoking were discussed with the patient. Successful methods of quitting smoking were discussed with the patient and he was given a prescription for varenicline.  Empiric  treatment of possible parapneumonia effusion with levaquin.     Orders Placed This Encounter  Procedures  . DG Chest 2 View    Order Specific Question:   Reason for exam:    Answer:   Hemoptysis, and smoking history assess intra-thoracic pathology    Order Specific Question:   Preferred imaging location?    [facility] CT Chest W Contrast    Standing Status:   Future    Standing Expiration Date:   05/25/2018    Order Specific Question:   If indicated for the ordered procedure, I authorize the administration of contrast media per Radiology protocol    Answer:   Yes    Order Specific Question:   Reason for Exam (SYMPTOM  OR DIAGNOSIS REQUIRED)    Answer:   eval pleural effusion ? nodule on CXR. Hemoptysis    Order Specific Question:   Preferred imaging location?    [facility]    Order Specific  Question:   Radiology Contrast Protocol - do NOT remove file path    Answer:   [email or web address]   Meds ordered this encounter  Medications  . diclofenac sodium (VOLTAREN) 1 % GEL    Sig: APP 4 GRAMS TO AFFECTED JOINT QID    Refill:  11  . cyclobenzaprine (FLEXERIL) 10 MG tablet    Sig: Take 1 tablet (10 mg total) by mouth 3 (three) times daily as needed for muscle spasms.    Dispense:  30 tablet    Refill:  0  . varenicline (CHANTIX STARTING MONTH PAK) 0.5 MG X 11 & 1 MG X 42 tablet    Sig: Take one 0.5mg  tablet by mouth once daily for 3 days, then increase to one 0.5mg  tablet twice daily for 3 days, then increase to one 1mg  tablet twice daily.    Dispense:  53 tablet    Refill:  0  . levofloxacin (LEVAQUIN) 500 MG tablet    Sig: Take 1 tablet (500 mg total) by mouth daily.    Dispense:  7 tablet    Refill:  0    Discussed warning signs or symptoms. Please see discharge instructions. Patient expresses understanding.

## 2017-04-07 ENCOUNTER — Encounter: Payer: Self-pay | Admitting: Physical Therapy

## 2017-04-07 ENCOUNTER — Ambulatory Visit (INDEPENDENT_AMBULATORY_CARE_PROVIDER_SITE_OTHER): Payer: BLUE CROSS/BLUE SHIELD | Admitting: Physical Therapy

## 2017-04-07 DIAGNOSIS — M62838 Other muscle spasm: Secondary | ICD-10-CM

## 2017-04-07 DIAGNOSIS — G8929 Other chronic pain: Secondary | ICD-10-CM | POA: Diagnosis not present

## 2017-04-07 DIAGNOSIS — M545 Low back pain: Secondary | ICD-10-CM

## 2017-04-07 DIAGNOSIS — M79631 Pain in right forearm: Secondary | ICD-10-CM | POA: Diagnosis not present

## 2017-04-07 NOTE — Patient Instructions (Addendum)
"  Let's Twist" Stretch    With both legs bent and feet flat on floor, cross one leg over. Keeping both shoulders on floor, roll crossed legs as far as possible without pain. Repeat with other leg. Repeat __2-3__ times. Hold 20-30 sec each side.  Do __1-2__ sessions per day.  Lower Trunk Rotation Stretch    Keeping back flat and feet together, rotate knees to left side. Hold __20-30__ seconds. Repeat _2-3___ times per set. Do __1__ sets per session. Do _1-2___ sessions per day.  Cat / Cow Flow    Inhale, press spine toward ceiling like a Halloween cat. Keeping strength in arms and abdominals, exhale to soften spine through neutral and into cow pose. Open chest and arch back. Initiate movement between cat and cow at tailbone, one vertebrae at a time. Repeat __10__ times.  Wrist Extensor Stretch    Keeping elbow straight, grasp left hand and slowly bend wrist forward until stretch is felt. Hold __20-30__ seconds. Relax. Repeat _2___ times per set. Do _1___ sets per session. Do __2-3__ sessions per day.  Copyright  VHI. All rights reserved.    IONTOPHORESIS PATIENT PRECAUTIONS & CONTRAINDICATIONS:  . Redness under one or both electrodes can occur.  This characterized by a uniform redness that usually disappears within 12 hours of treatment. . Small pinhead size blisters may result in response to the drug.  Contact your physician if the problem persists more than 24 hours. . On rare occasions, iontophoresis therapy can result in temporary skin reactions such as rash, inflammation, irritation or burns.  The skin reactions may be the result of individual sensitivity to the ionic solution used, the condition of the skin at the start of treatment, reaction to the materials in the electrodes, allergies or sensitivity to dexamethasone, or a poor connection between the patch and your skin.  Discontinue using iontophoresis if you have any of these reactions and report to your  therapist. . Remove the Patch or electrodes if you have any undue sensation of pain or burning during the treatment and report discomfort to your therapist. . Tell your Therapist if you have had known adverse reactions to the application of electrical current. . If using the Patch, the LED light will turn off when treatment is complete and the patch can be removed.  Approximate treatment time is 1-3 hours.  Remove the patch when light goes off or after 6 hours. . The Patch can be worn during normal activity, however excessive motion where the electrodes have been placed can cause poor contact between the skin and the electrode or uneven electrical current resulting in greater risk of skin irritation. Marland Kitchen. Keep out of the reach of children.   . DO NOT use if you have a cardiac pacemaker or any other electrically sensitive implanted device. . DO NOT use if you have a known sensitivity to dexamethasone. . DO NOT use during Magnetic Resonance Imaging (MRI). . DO NOT use over broken or compromised skin (e.g. sunburn, cuts, or acne) due to the increased risk of skin reaction. . DO NOT SHAVE over the area to be treated:  To establish good contact between the Patch and the skin, excessive hair may be clipped. . DO NOT place the Patch or electrodes on or over your eyes, directly over your heart, or brain. . DO NOT reuse the Patch or electrodes as this may cause burns to occur.

## 2017-04-07 NOTE — Therapy (Signed)
[facility] [address] [place], [place], [address] Phone: [phone]   Fax:  [phone]  Physical Therapy Treatment  Patient Details  Name: Michael Meza MRN: [id number] Date of Birth: Jan 18, 1966 Referring Provider: Dr Clementeen GrahamEvan Corey   Encounter Date: 04/07/2017      PT End of Session - 04/07/17 1149    Visit Number 1   Number of Visits 6   Date for PT Re-Evaluation 06/30/17   Authorization Type pt coming every other week due to finances and work schedule   PT Start Time 1149   PT Stop Time 1234   PT Time Calculation (min) 45 min   Activity Tolerance Patient tolerated treatment well      Past Medical History:  Diagnosis Date  . Alcohol abuse 10/30/2016  . Depression 08/31/2014  . Essential hypertension, benign 03/07/2014  . Gout 03/07/2014   Not currently on treatment for prevention.      History reviewed. No pertinent surgical history.  There were no vitals filed for this visit.      Subjective Assessment - 04/07/17 1150    Subjective Pt reports about 3-4 yrs ago he hurt his back carrying a sofa into a house and the pain has gotten progressively worse.  The flexerol has allowed him to sleep through the night.  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  He noticed Rt elbow pain about 2 months ago after picking up and moving a glider.  He is wearing a strap on his arm when working and it helps some. Has pain with picking up a coffee cup.    How long can you sit comfortably? no limiations in a chair, will have some pain with driving.    How long can you walk comfortably? only limited now due to gout flare up.    Diagnostic tests x-rays show some degeneration in the back   Patient Stated Goals get healthy    Currently in Pain? Yes   Pain Score 3    Pain Location Arm   Pain Orientation Right   Pain Type Acute pain   Pain Onset More than a month ago   Pain Frequency Intermittent   Aggravating Factors   lifting items, turning the steering wheel   Pain Relieving Factors nothing   Multiple Pain Sites --  no back pain today.             [facility] PT Assessment - 04/07/17 0001      Assessment   Medical Diagnosis Rt lateral epicondylitis, lumbosacral strain   Referring Provider Dr Clementeen GrahamEvan Corey    Onset Date/Surgical Date 02/05/17  for arm, 3-4 yrs ago for back   Hand Dominance Right   Next MD Visit --  after CT scan   Prior Therapy none     Precautions   Precautions None     Balance Screen   Has the patient fallen in the past 6 months Yes   How many times? 1  fell off the back of his trailer about 3 months ago   Has the patient had a decrease in activity level because of a fear of falling?  No   Is the patient reluctant to leave their home because of a fear of falling?  No     Prior Function   Vocation Full time employment   Vocation Requirements truck driver, furniture lifting     Observation/Other Assessments   Focus on Therapeutic Outcomes (FOTO)  43% limited     Functional Tests   Functional tests Squat;Single leg stance     Squat   Comments WNL     Single Leg Stance   Comments WNL     Posture/Postural Control   Posture/Postural Control Postural limitations   Postural Limitations Decreased lumbar lordosis;Flexed trunk  leaning to the Rt questionable scolioisis     ROM / Strength   AROM / PROM / Strength AROM;Strength     AROM   AROM Assessment Site Shoulder;Elbow;Wrist;Lumbar   Right/Left Shoulder --  WNL   Right/Left Elbow --  WNL   Right/Left Wrist --  WNL   Lumbar Flexion to the floor   Lumbar Extension WNL some pain in low back.    Lumbar - Right Side Bend WNL   Lumbar - Left Side Bend WNL   Lumbar - Right Rotation WNL   Lumbar - Left Rotation WNL     Strength   Strength Assessment Site Hip;Knee;Ankle;Hand;Shoulder;Elbow;Forearm   Right/Left Shoulder --  WNL   Right/Left Elbow --  WNL   Right/Left Forearm --  WNL   Right/Left hand --  grip  Lt 84#, Rt 66# with pain in forearm   Right/Left Hip --  WNL   Right/Left Knee --  WNL   Right/Left Ankle --  WNL     Flexibility   Soft Tissue Assessment /Muscle Length yes   Hamstrings supine SLR Lt 82, Rt    Quadriceps prone knee flex Lt 125, Rt 135     Palpation   Spinal mobility lumbar/thoracic WNL, some pain  with L2-3 CPA mobs   Palpation comment very tight in bilat lumbar paraspinals and QLs, pain and palpable trigger points in Rt forearm extensor group     Special Tests    Special Tests --  (-) SLR and SIJ tests                     [facility] Adult PT Treatment/Exercise - 04/07/17 0001      Exercises   Exercises Wrist;Lumbar     Lumbar Exercises: Stretches   Lower Trunk Rotation 2 reps;30 seconds   Quadruped Mid Back Stretch 5 reps   Piriformis Stretch 2 reps;30 seconds     Wrist Exercises   Wrist Flexion --  stretch     Modalities   Modalities Iontophoresis     Iontophoresis   Type of Iontophoresis Dexamethasone   Location Rt extensor carpi radialis    Dose 1.0cc   Time patch                PT Education - 04/07/17 1221    Education provided Yes   Education Details HEP , ionto   Person(s) Educated Patient   Methods Explanation;Demonstration;Handout   Comprehension Returned demonstration;Verbalized understanding             PT Long Term Goals - 04/07/17 1256      PT LONG TERM GOAL #1   Title I with advanced HEP    Time 12   Period Weeks   Status New   Target Date 06/30/17     PT LONG TERM GOAL #2   Title report =/> 75% reduction in low back pain with driving and sleeping to allow him to wean off meds    Time 12   Period Weeks   Target Date 06/30/17     PT LONG TERM GOAL #3   Title report =/> 75% reduction of Rt forearm pain  with moving furniture and picking up his coffee mug.    Time 12   Period Weeks   Status New   Target Date 06/30/17     PT LONG TERM GOAL #4   Title improve FOTO =/< 32% limited     Time 12   Period Weeks   Status New   Target Date 06/30/17               Plan - 04/07/17 1247    Clinical Impression Statement 51 yo male with long h/o low back pain that has gotten worse lately and 2 month onset of Rt forearm pain that began after moving a glider chair.  Over all his ROM is WNL, he does have tightness in his low back and hip flexors. He has a band of tightness in the  Rt  extensor carpi radialis  with pain. In standing and sitting pt is in a forward and right side lean, this is absent in supine and prone.  Progress may be slower as patient is only able to attend PT every other week.    Clinical Presentation Stable   Clinical Decision Making Low   Rehab Potential Good   PT Frequency Biweekly   PT Duration 12 weeks   PT Treatment/Interventions Moist Heat;Iontophoresis 4mg /ml Dexamethasone;Ultrasound;Traction;Electrical Stimulation;Patient/family education;Neuromuscular re-education;Manual techniques;Dry needling;Taping   PT Next Visit Plan hip flexor stretches, US/ionto to Rt forearm.  Manual therapy PRN   Consulted and Agree with Plan of Care Patient      Patient will benefit from skilled therapeutic intervention in order to improve the following deficits and impairments:  Pain, Postural dysfunction, Increased muscle spasms  Visit Diagnosis: Pain in right forearm - Plan: PT plan of care cert/re-cert  Chronic bilateral low back pain without sciatica - Plan: PT plan of care cert/re-cert  Other muscle spasm - Plan: PT plan of care cert/re-cert     Problem List Patient Active Problem List   Diagnosis Date Noted  . Hemoptysis 03/24/2017  . Pleural effusion 03/24/2017  . Arthritis of knee, left 02/25/2017  . Alcohol abuse 10/30/2016  . Anxiety 08/31/2014  . Depression 08/31/2014  . Gout 03/07/2014  . Essential hypertension, benign 03/07/2014  . Tobacco abuse 03/02/2014    Roderic Scarce PT  04/07/2017, 1:08 PM  [facility] [address] [place], [place], [address] Phone: [phone]   Fax:  [phone]  Name: Michael Meza MRN: [id number] Date of Birth: 11/01/1965

## 2017-04-21 ENCOUNTER — Encounter: Payer: BLUE CROSS/BLUE SHIELD | Admitting: Physical Therapy

## 2017-05-04 ENCOUNTER — Other Ambulatory Visit: Payer: Self-pay | Admitting: Physician Assistant

## 2017-05-06 ENCOUNTER — Encounter: Payer: BLUE CROSS/BLUE SHIELD | Admitting: Physical Therapy

## 2017-05-06 ENCOUNTER — Other Ambulatory Visit: Payer: Self-pay | Admitting: Family Medicine

## 2017-05-06 DIAGNOSIS — R042 Hemoptysis: Secondary | ICD-10-CM

## 2017-05-19 ENCOUNTER — Encounter: Payer: Self-pay | Admitting: Physical Therapy

## 2017-05-19 ENCOUNTER — Ambulatory Visit (INDEPENDENT_AMBULATORY_CARE_PROVIDER_SITE_OTHER): Payer: BLUE CROSS/BLUE SHIELD | Admitting: Physical Therapy

## 2017-05-19 DIAGNOSIS — M545 Low back pain: Secondary | ICD-10-CM

## 2017-05-19 DIAGNOSIS — M79631 Pain in right forearm: Secondary | ICD-10-CM | POA: Diagnosis not present

## 2017-05-19 DIAGNOSIS — M62838 Other muscle spasm: Secondary | ICD-10-CM | POA: Diagnosis not present

## 2017-05-19 DIAGNOSIS — G8929 Other chronic pain: Secondary | ICD-10-CM | POA: Diagnosis not present

## 2017-05-19 NOTE — Patient Instructions (Addendum)
Opposite Arm / Leg Lift (Prone)    Abdomen and head supported, left knee locked, raise leg and opposite arm __6-8__ inches from floor. Repeat ___10-15_ times per set. Do __2__ sets per session. Do __1__ sessions per day.  Hip External Rotation With Pillow: Transverse Plane Stability   One knee bent, one leg straight, on pillow. Slowly roll bent knee out. Be sure pelvis does not rotate. Do _10__ times. Restabilize pelvis. Repeat with other leg. Do _2__ sets, _1__ times per day.  Bridging    Slowly raise buttocks from floor, keeping stomach tight. Hold 5 sec, repeat __10__ times per set. Do __2__ sets per session. Do __1__ sessions per day.   Wrist Extension: Resisted    With tubing wrapped around right fist and other end secured under foot, bend wrist up (palm down) as far as possible. Keep forearm on thigh. Slowly lower hand down, count of 6 sec.  Repeat __10__ times per set. Do __3__ sets per session. Do __1__ sessions per day.

## 2017-05-19 NOTE — Therapy (Addendum)
[facility] [place], [place], [address] Phone: [phone]   Fax:  [phone]  Physical Therapy Treatment  Patient Details  Name: Michael Meza MRN: [id number] Date of Birth: Jan 29, 1966 Referring Provider: Dr Georgina Snell  Encounter Date: 05/19/2017      PT End of Session - 05/19/17 1145    Visit Number 2   Number of Visits 6   Date for PT Re-Evaluation 06/30/17   Authorization Type pt coming every other week due to finances and work schedule   PT Start Time 1145   PT Stop Time 1226   PT Time Calculation (min) 41 min   Activity Tolerance Patient tolerated treatment well      Past Medical History:  Diagnosis Date  . Alcohol abuse 10/30/2016  . Depression 08/31/2014  . Essential hypertension, benign 03/07/2014  . Gout 03/07/2014   Not currently on treatment for prevention.      History reviewed. No pertinent surgical history.  There were no vitals filed for this visit.      Subjective Assessment - 05/19/17 1146    Subjective Pt reports he has a disc out in his neck.  Using the towel behind his back with driving and it helps.  Has been trying to do exercise however lost his paper.  Not using flexoril now.    Patient Stated Goals get healthy    Currently in Pain? Yes   Pain Score 4    Pain Location Back   Pain Orientation Lower;Mid   Pain Type Chronic pain   Pain Onset More than a month ago   Pain Frequency Intermittent   Aggravating Factors  lifting,    Pain Relieving Factors exercise.             [facility] PT Assessment - 05/19/17 0001      Assessment   Medical Diagnosis Rt lateral epicondylitis, lumbosacral strain   Referring Provider Dr Georgina Snell   Onset Date/Surgical Date 02/05/17   Hand Dominance Right                     [facility] Adult PT Treatment/Exercise - 05/19/17 0001      Lumbar Exercises: Stretches   Double Knee to Chest Stretch 30 seconds   ITB Stretch 3 reps;30 seconds   cross body with strap     Lumbar Exercises: Aerobic   UBE (Upper Arm Bike) standing L2x4' alt FWD/BWD     Lumbar Exercises: Supine   Clam 20 reps  VC for form   Bridge 20 reps     Lumbar Exercises: Prone   Opposite Arm/Leg Raise Left arm/Right leg;Right arm/Left leg;20 reps  VC for form   Other Prone Lumbar Exercises 2x10 lower body lifts      Wrist Exercises   Other wrist exercises wrist extension, green band focus on eccentric motion,3x10reps     Modalities   Modalities Iontophoresis     Iontophoresis   Type of Iontophoresis Dexamethasone   Location Rt extensor carpi radialis    Dose 1.0cc   Time 132mp patch     Manual Therapy   Manual Therapy Soft tissue mobilization   Soft tissue mobilization Rt forearm extensor mass STM                PT Education - 05/19/17 1203    Education provided Yes   Education Details HEP   Person(s) Educated Patient   Methods Explanation;Demonstration;Handout   Comprehension Returned demonstration;Verbalized understanding  PT Long Term Goals - 05/19/17 1218      PT LONG TERM GOAL #1   Title I with advanced HEP    Status On-going     PT LONG TERM GOAL #2   Title report =/> 75% reduction in low back pain with driving and sleeping to allow him to wean off meds    Status Partially Met  no taking flexoril now     PT LONG TERM GOAL #3   Title report =/> 75% reduction of Rt forearm pain with moving furniture and picking up his coffee mug.    Status Partially Met  only has ache with moving furniture.      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 He is having less pain in his arm and low back, progressing to his goals.  His primary concern is getting his back "straight".  Strengtheniing  added in to help with with a long with wrist eccentrics.    Rehab Potential Good   PT Frequency Biweekly   PT Duration 12 weeks   PT Treatment/Interventions Moist Heat;Iontophoresis '4mg'$ /ml Dexamethasone;Ultrasound;Traction;Electrical Stimulation;Patient/family education;Neuromuscular re-education;Manual techniques;Dry needling;Taping   PT Next Visit Plan FOTO , progress HEP and assess for discharge.    Consulted and Agree with Plan of Care Patient      Patient will benefit from skilled therapeutic intervention in order to improve the following deficits and impairments:  Pain, Postural dysfunction, Increased muscle spasms  Visit Diagnosis: Other muscle spasm  Chronic bilateral low back pain without sciatica  Pain in right forearm     Problem List Patient Active Problem List   Diagnosis Date Noted  . Hemoptysis 03/24/2017  . Pleural effusion 03/24/2017  . Arthritis of knee, left 02/25/2017  . Alcohol abuse 10/30/2016  . Anxiety 08/31/2014  . Depression 08/31/2014  . Gout 03/07/2014  . Essential hypertension, benign 03/07/2014  . Tobacco abuse 03/02/2014    Manuela Schwartz shaver PT 05/19/2017, 12:28 PM  [facility] [place], [place], [address] Phone: [phone]   Fax:  [phone]  Name: Michael Meza MRN: [id number] Date of Birth: [birth year]   PHYSICAL THERAPY DISCHARGE SUMMARY  Visits from Start of Care: 2  Current functional level related to goals / functional outcomes: unknown   Remaining deficits: unknown   Education / Equipment: HEP Plan:                                                    Patient goals were partially met. Patient is being discharged due to not returning since the last visit.  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     Jeral Pinch, PT 07/16/17 10:57 AM

## 2017-05-23 ENCOUNTER — Other Ambulatory Visit: Payer: Self-pay | Admitting: Physician Assistant

## 2017-05-23 ENCOUNTER — Telehealth: Payer: Self-pay

## 2017-05-23 DIAGNOSIS — I10 Essential (primary) hypertension: Secondary | ICD-10-CM

## 2017-05-23 NOTE — Telephone Encounter (Signed)
Pt notified. States that he want to quit smoking but was advised by "the man" at his employer that he cannot take chantix while driving. Advised pt to follow up with PCP. Appt scheduled.

## 2017-05-23 NOTE — Telephone Encounter (Signed)
I never received results from [facility]. After getting a copy of the results they are as follows  Small-to-moderate right pleural effusion with associated dependent atelectasis of the right lower lobe.  Mild interstitial pulmonary edema.  Findings suggesting respiratory bronchiolitis. Suggest correlation with smoking history. A specific cause for the patient's hemoptysis is not identified.    ----- This is reassuring.--- You do not have lung cancer. The blood in the situm was due to coughing ---.  Return as needed

## 2017-05-23 NOTE — Telephone Encounter (Signed)
Pt had a CT at [facility] that you sent him for.  He has not received any results.  Have you received anything from [facility]?  Please advise.

## 2017-06-02 ENCOUNTER — Encounter: Payer: BLUE CROSS/BLUE SHIELD | Admitting: Physical Therapy

## 2017-06-23 ENCOUNTER — Ambulatory Visit: Payer: BLUE CROSS/BLUE SHIELD | Admitting: Physician Assistant

## 2017-07-07 ENCOUNTER — Ambulatory Visit: Payer: BLUE CROSS/BLUE SHIELD | Admitting: Family Medicine

## 2017-07-27 ENCOUNTER — Other Ambulatory Visit: Payer: Self-pay | Admitting: Physician Assistant

## 2017-07-27 DIAGNOSIS — I10 Essential (primary) hypertension: Secondary | ICD-10-CM

## 2017-07-29 ENCOUNTER — Other Ambulatory Visit: Payer: Self-pay | Admitting: *Deleted

## 2017-07-29 DIAGNOSIS — M1A00X0 Idiopathic chronic gout, unspecified site, without tophus (tophi): Secondary | ICD-10-CM

## 2017-07-29 MED ORDER — COLCHICINE 0.6 MG PO TABS: ORAL_TABLET | ORAL | 5 refills | Status: DC

## 2017-08-02 ENCOUNTER — Other Ambulatory Visit: Payer: Self-pay | Admitting: Physician Assistant

## 2017-09-07 ENCOUNTER — Other Ambulatory Visit: Payer: Self-pay | Admitting: Physician Assistant

## 2017-09-07 DIAGNOSIS — I10 Essential (primary) hypertension: Secondary | ICD-10-CM

## 2017-09-12 ENCOUNTER — Other Ambulatory Visit: Payer: Self-pay | Admitting: Physician Assistant

## 2017-10-03 ENCOUNTER — Ambulatory Visit: Payer: BLUE CROSS/BLUE SHIELD | Admitting: Physician Assistant

## 2017-10-03 ENCOUNTER — Encounter: Payer: Self-pay | Admitting: Physician Assistant

## 2017-10-03 VITALS — BP 134/82 | HR 77 | Ht 71.0 in | Wt 190.0 lb

## 2017-10-03 DIAGNOSIS — R490 Dysphonia: Secondary | ICD-10-CM | POA: Diagnosis not present

## 2017-10-03 DIAGNOSIS — R062 Wheezing: Secondary | ICD-10-CM

## 2017-10-03 DIAGNOSIS — R1110 Vomiting, unspecified: Secondary | ICD-10-CM

## 2017-10-03 DIAGNOSIS — K2100 Gastro-esophageal reflux disease with esophagitis, without bleeding: Secondary | ICD-10-CM

## 2017-10-03 DIAGNOSIS — R1010 Upper abdominal pain, unspecified: Secondary | ICD-10-CM

## 2017-10-03 DIAGNOSIS — F17200 Nicotine dependence, unspecified, uncomplicated: Secondary | ICD-10-CM | POA: Diagnosis not present

## 2017-10-03 LAB — CBC WITH DIFFERENTIAL/PLATELET
Basophils Absolute: 30 cells/uL (ref 0–200)
Basophils Relative: 0.6 %
Eosinophils Absolute: 260 cells/uL (ref 15–500)
Eosinophils Relative: 5.2 %
HCT: 48.9 % (ref 38.5–50.0)
Hemoglobin: 16.9 g/dL (ref 13.2–17.1)
Lymphs Abs: 1175 cells/uL (ref 850–3900)
MCH: 33.5 pg — ABNORMAL HIGH (ref 27.0–33.0)
MCHC: 34.6 g/dL (ref 32.0–36.0)
MCV: 96.8 fL (ref 80.0–100.0)
MPV: 11 fL (ref 7.5–12.5)
Monocytes Relative: 15.3 %
Neutro Abs: 2770 cells/uL (ref 1500–7800)
Neutrophils Relative %: 55.4 %
Platelets: 188 10*3/uL (ref 140–400)
RBC: 5.05 10*6/uL (ref 4.20–5.80)
RDW: 12.8 % (ref 11.0–15.0)
Total Lymphocyte: 23.5 %
WBC mixed population: 765 cells/uL (ref 200–950)
WBC: 5 10*3/uL (ref 3.8–10.8)

## 2017-10-03 LAB — COMPLETE METABOLIC PANEL WITH GFR
AG Ratio: 1.9 (calc) (ref 1.0–2.5)
ALT: 45 U/L (ref 9–46)
AST: 63 U/L — ABNORMAL HIGH (ref 10–35)
Albumin: 4.2 g/dL (ref 3.6–5.1)
Alkaline phosphatase (APISO): 87 U/L (ref 40–115)
BUN/Creatinine Ratio: 8 (calc) (ref 6–22)
BUN: 6 mg/dL — ABNORMAL LOW (ref 7–25)
CO2: 29 mmol/L (ref 20–32)
Calcium: 9.2 mg/dL (ref 8.6–10.3)
Chloride: 105 mmol/L (ref 98–110)
Creat: 0.73 mg/dL (ref 0.70–1.33)
GFR, Est African American: 124 mL/min/{1.73_m2} (ref >=60)
GFR, Est Non African American: 107 mL/min/{1.73_m2} (ref >=60)
Globulin: 2.2 g/dL (calc) (ref 1.9–3.7)
Glucose, Bld: 108 mg/dL — ABNORMAL HIGH (ref 65–99)
Potassium: 4.6 mmol/L (ref 3.5–5.3)
Sodium: 139 mmol/L (ref 135–146)
Total Bilirubin: 0.9 mg/dL (ref 0.2–1.2)
Total Protein: 6.4 g/dL (ref 6.1–8.1)

## 2017-10-03 LAB — LIPASE: Lipase: 50 U/L (ref 7–60)

## 2017-10-03 MED ORDER — ESOMEPRAZOLE MAGNESIUM 40 MG PO CPDR: 40.0000 mg | DELAYED_RELEASE_CAPSULE | Freq: Every day | ORAL | 3 refills | Status: DC

## 2017-10-03 MED ORDER — PREDNISONE 50 MG PO TABS: ORAL_TABLET | ORAL | 0 refills | Status: DC

## 2017-10-03 NOTE — Progress Notes (Signed)
Subjective:    Patient ID: Michael Meza, male    DOB: 15-Jun-1966, 52 y.o.   MRN: [id number]  HPI  Pt is a 52 yo male with HTN, GERD who presents to the clinic with hoarsness, dry heaving, upper abdominal pain that has been worsening over the past 2 weeks but the worst of the weekend. Denies any diarrhea or vomiting. He has only been dry heaving. His hoarseness seems to be worse as well. Denies any blood in stool. He smokes 2 packs a day and drinks 6 beers a night. He has not been on omeprazole in a long time. No fever, chills, sinus pressure, ear pain. He does have some SOB and wheezing.   .. Active Ambulatory Problems    Diagnosis Date Noted  . Tobacco abuse 03/02/2014  . Gout 03/07/2014  . Essential hypertension, benign 03/07/2014  . Anxiety 08/31/2014  . Depression 08/31/2014  . Alcohol abuse 10/30/2016  . Arthritis of knee, left 02/25/2017  . Hemoptysis 03/24/2017  . Pleural effusion 03/24/2017  . Dry heaves 10/05/2017  . Upper abdominal pain 10/05/2017  . Gastroesophageal reflux disease with esophagitis 10/05/2017  . Hoarseness 10/05/2017  . Wheezing 10/05/2017  . Current smoker 10/05/2017   Resolved Ambulatory Problems    Diagnosis Date Noted  . Acute gout 03/07/2014  . Pain in the chest 03/07/2014  . Right foot pain 06/14/2014   Past Medical History:  Diagnosis Date  . Alcohol abuse 10/30/2016  . Depression 08/31/2014  . Essential hypertension, benign 03/07/2014  . Gout 03/07/2014      Review of Systems  All other systems reviewed and are negative.      Objective:   Physical Exam  Constitutional: He is oriented to person, place, and time. He appears well-developed and well-nourished.  HENT:  Head: Normocephalic and atraumatic.  Cardiovascular: Normal rate, regular rhythm and normal heart sounds.  Pulmonary/Chest: Effort normal. He has wheezes.  Bilateral wheezing over base of both lungs worse on right.   Abdominal: Soft. Bowel sounds are normal. He  exhibits no distension and no mass. There is tenderness. There is no rebound and no guarding.  Diffuse tenderness over epigastric and LUQ pain.   Lymphadenopathy:    He has no cervical adenopathy.  Neurological: He is alert and oriented to person, place, and time.  Psychiatric: He has a normal mood and affect. His behavior is normal.          Assessment & Plan:  Marland Kitchen.Marland Kitchen.Michael Meza was seen today for gastroesophageal reflux.  Diagnoses and all orders for this visit:  Dry heaves -     COMPLETE METABOLIC PANEL WITH GFR -     Lipase -     CBC with Differential/Platelet  Upper abdominal pain -     COMPLETE METABOLIC PANEL WITH GFR -     Lipase -     CBC with Differential/Platelet  Gastroesophageal reflux disease with esophagitis -     COMPLETE METABOLIC PANEL WITH GFR -     Lipase -     CBC with Differential/Platelet -     esomeprazole (NEXIUM) 40 MG capsule; Take 1 capsule (40 mg total) by mouth daily.  Hoarseness -     esomeprazole (NEXIUM) 40 MG capsule; Take 1 capsule (40 mg total) by mouth daily.  Wheezing -     predniSONE (DELTASONE) 50 MG tablet; One tab PO daily for 5 days.  Current smoker   Vitals look good. Will get labs to look at pancreatic enzymes for  pancreatitis and hgb for any blood loss. Unclear etiology. Could be worsening GERD. Start nexium 40mg  daily. If not improving need to get scan of abdomen. Obviously with hoarness, alcohol, smoking history concern for cancer as well. If not improving on PPI. Suggest imaging of abdomen and endoscopy. Wheezing on exam today. Pt likely has some COPD due to smoking hx. Treated with course of prednisone. No signs of bacterial infection. Pt is trying to cut back but not willing to quit smoking.

## 2017-10-04 ENCOUNTER — Other Ambulatory Visit: Payer: Self-pay | Admitting: Physician Assistant

## 2017-10-04 DIAGNOSIS — I10 Essential (primary) hypertension: Secondary | ICD-10-CM

## 2017-10-05 ENCOUNTER — Encounter: Payer: Self-pay | Admitting: Physician Assistant

## 2017-10-05 DIAGNOSIS — R490 Dysphonia: Secondary | ICD-10-CM | POA: Insufficient documentation

## 2017-10-05 DIAGNOSIS — R1110 Vomiting, unspecified: Secondary | ICD-10-CM | POA: Insufficient documentation

## 2017-10-05 DIAGNOSIS — F17200 Nicotine dependence, unspecified, uncomplicated: Secondary | ICD-10-CM | POA: Insufficient documentation

## 2017-10-05 DIAGNOSIS — R1010 Upper abdominal pain, unspecified: Secondary | ICD-10-CM | POA: Insufficient documentation

## 2017-10-05 DIAGNOSIS — K2100 Gastro-esophageal reflux disease with esophagitis, without bleeding: Secondary | ICD-10-CM | POA: Insufficient documentation

## 2017-10-05 DIAGNOSIS — R062 Wheezing: Secondary | ICD-10-CM | POA: Insufficient documentation

## 2017-10-31 ENCOUNTER — Other Ambulatory Visit: Payer: Self-pay | Admitting: Physician Assistant

## 2017-10-31 MED ORDER — COLCHICINE 0.6 MG PO CAPS: 1.0000 | ORAL_CAPSULE | Freq: Every day | ORAL | 5 refills | Status: DC

## 2017-12-02 IMAGING — DX DG CHEST 2V
2 series · 2 of 2 positions shown · non-contrast
Comparison: None.

CLINICAL DATA: Hemoptysis for several days, history of smoking

EXAM:
CHEST  2 VIEW

[chest pa]
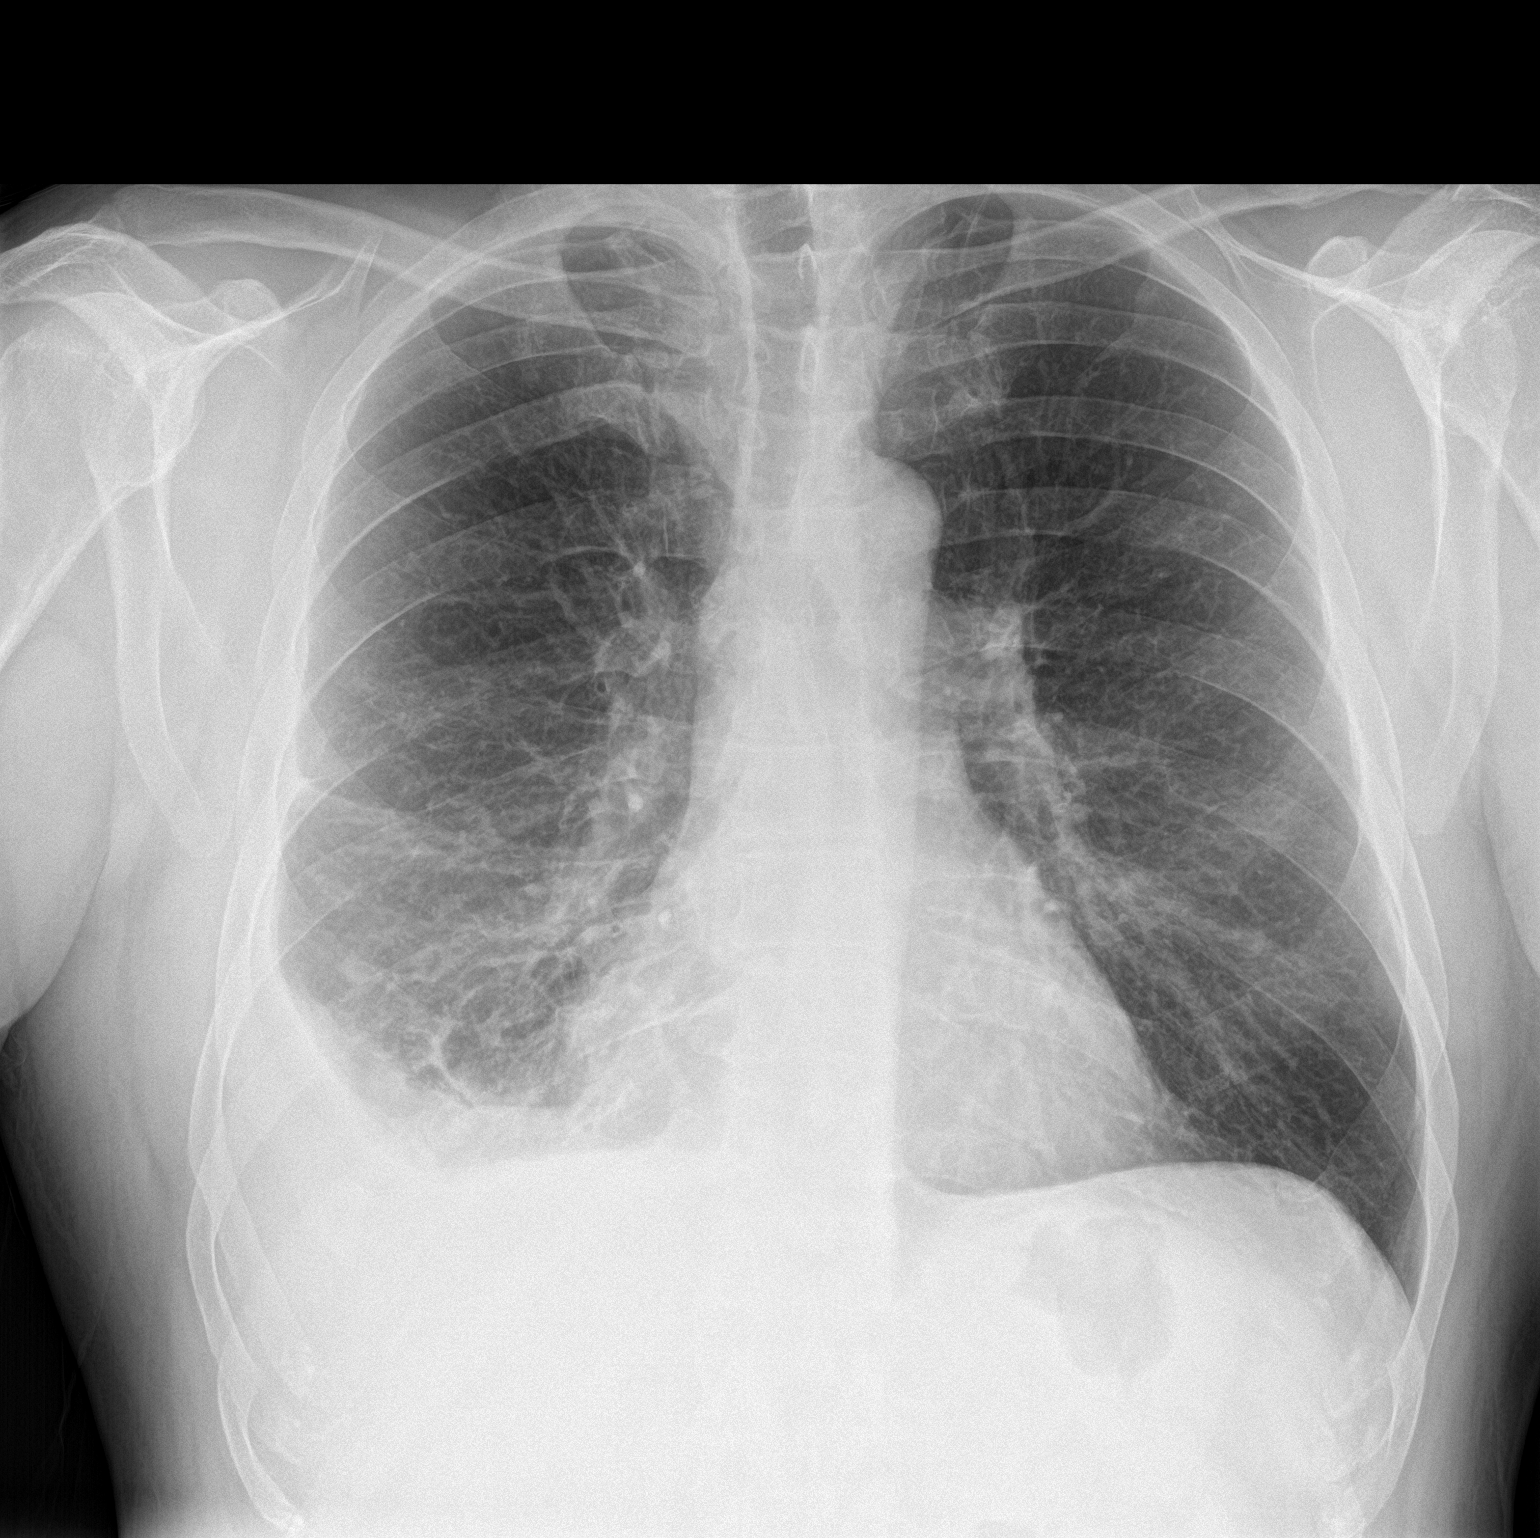

[chest lat]
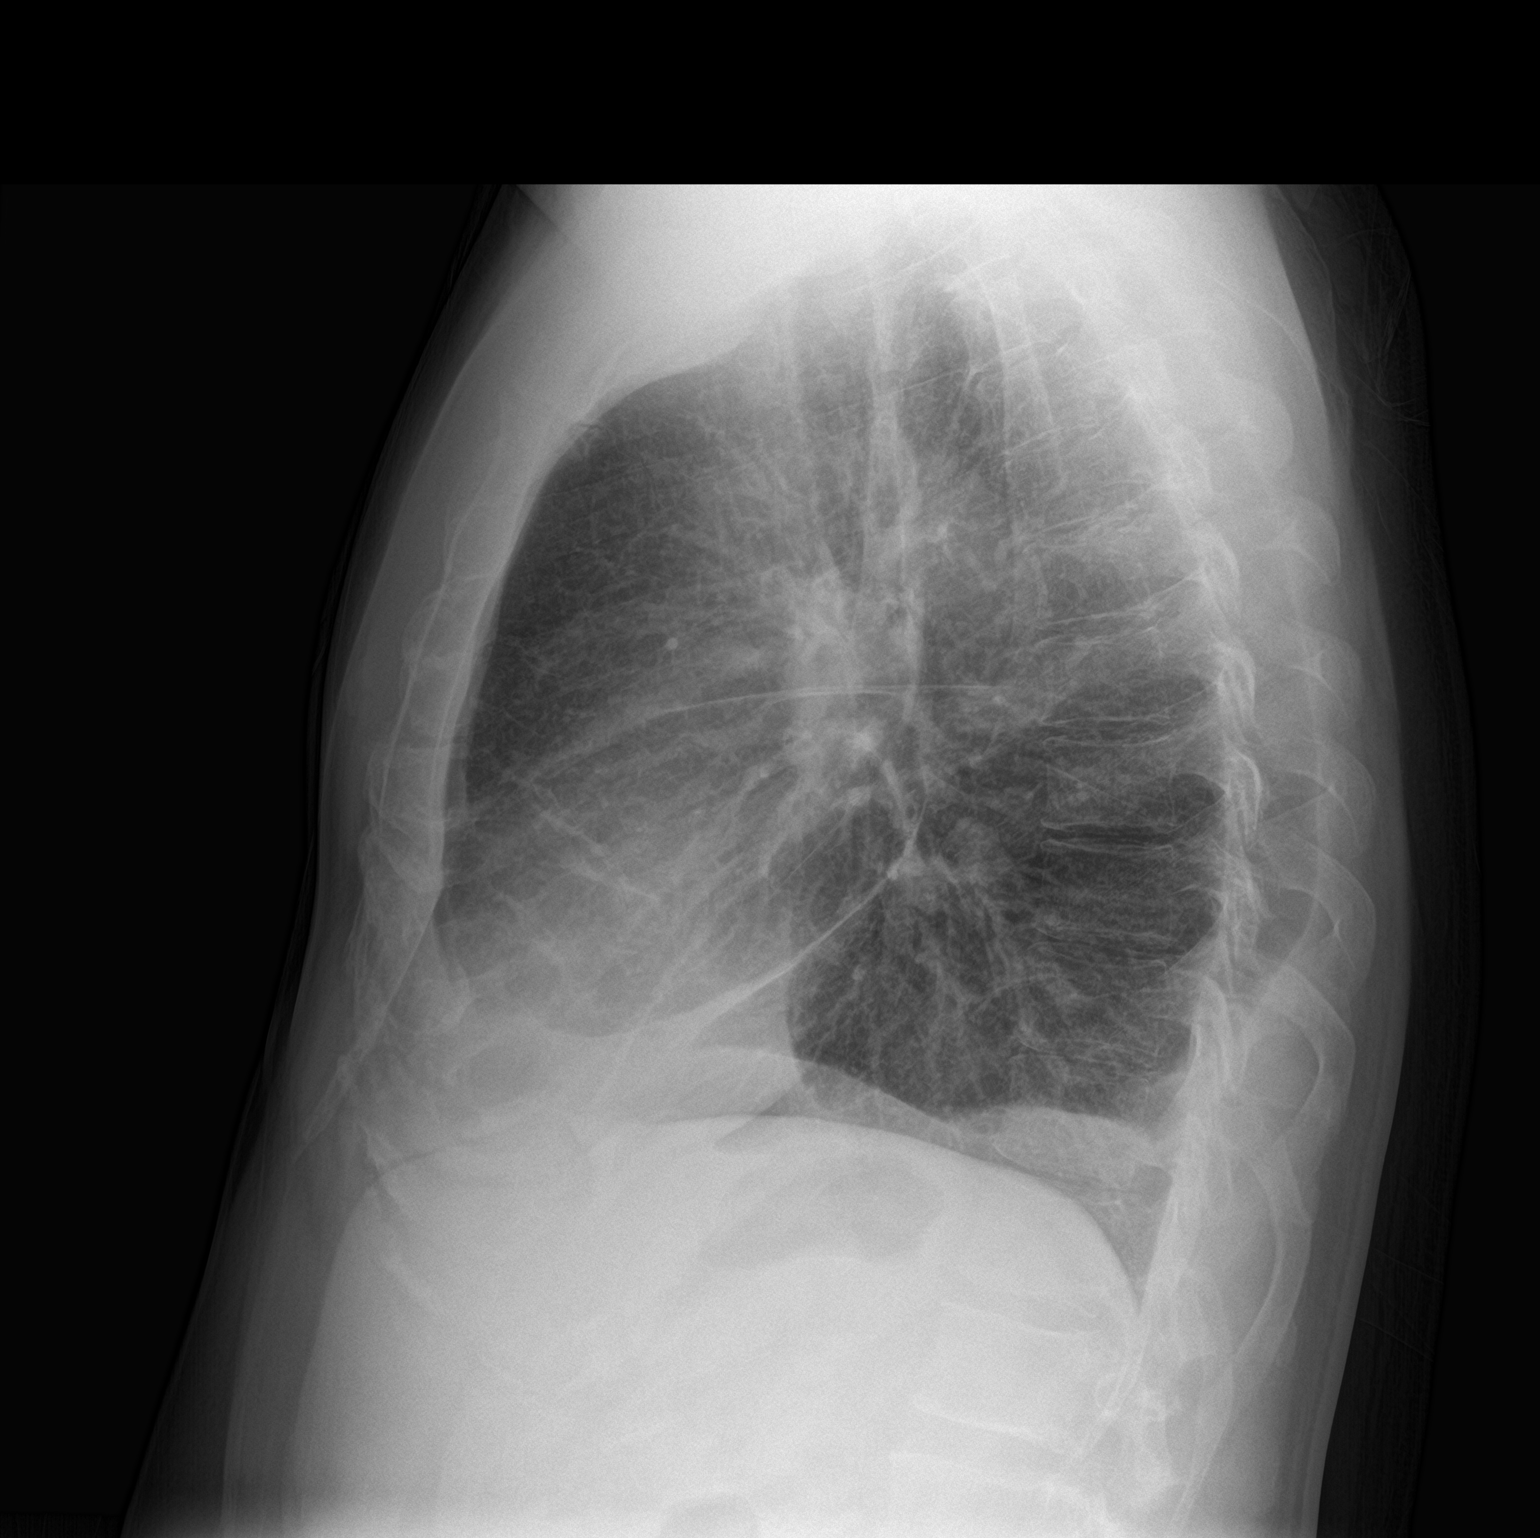

[2 of 2 positions shown; findings below may reference images not displayed]

FINDINGS: Cardiac shadow is within normal limits. The left lung is clear.
Small right pleural effusion is noted. There is likely some
underlying atelectasis although no focal confluent infiltrate is
seen. No bony abnormality is noted.
IMPRESSION: Small right pleural effusion with likely underlying atelectasis.

## 2018-01-04 ENCOUNTER — Other Ambulatory Visit: Payer: Self-pay | Admitting: Physician Assistant

## 2018-01-30 ENCOUNTER — Other Ambulatory Visit: Payer: Self-pay | Admitting: Physician Assistant

## 2018-01-30 DIAGNOSIS — I10 Essential (primary) hypertension: Secondary | ICD-10-CM

## 2018-03-05 ENCOUNTER — Other Ambulatory Visit: Payer: Self-pay | Admitting: Physician Assistant

## 2018-03-05 DIAGNOSIS — I10 Essential (primary) hypertension: Secondary | ICD-10-CM

## 2018-04-03 ENCOUNTER — Other Ambulatory Visit: Payer: Self-pay | Admitting: Physician Assistant

## 2018-04-03 DIAGNOSIS — I10 Essential (primary) hypertension: Secondary | ICD-10-CM

## 2018-04-06 ENCOUNTER — Telehealth: Payer: Self-pay

## 2018-04-06 DIAGNOSIS — I10 Essential (primary) hypertension: Secondary | ICD-10-CM

## 2018-04-06 MED ORDER — ALLOPURINOL 100 MG PO TABS: 100.0000 mg | ORAL_TABLET | Freq: Two times a day (BID) | ORAL | 0 refills | Status: DC

## 2018-04-06 MED ORDER — METOPROLOL TARTRATE 50 MG PO TABS: 50.0000 mg | ORAL_TABLET | Freq: Two times a day (BID) | ORAL | 0 refills | Status: DC

## 2018-04-06 NOTE — Telephone Encounter (Signed)
RX for gout and BP sent to hold pt over until appt

## 2018-04-27 ENCOUNTER — Encounter: Payer: BLUE CROSS/BLUE SHIELD | Admitting: Physician Assistant

## 2018-05-07 ENCOUNTER — Other Ambulatory Visit: Payer: Self-pay | Admitting: Physician Assistant

## 2018-05-07 DIAGNOSIS — I10 Essential (primary) hypertension: Secondary | ICD-10-CM

## 2018-05-18 ENCOUNTER — Encounter: Payer: Self-pay | Admitting: Physician Assistant

## 2018-05-18 ENCOUNTER — Ambulatory Visit (INDEPENDENT_AMBULATORY_CARE_PROVIDER_SITE_OTHER): Payer: BLUE CROSS/BLUE SHIELD | Admitting: Physician Assistant

## 2018-05-18 VITALS — BP 121/86 | HR 78 | Ht 71.0 in | Wt 194.0 lb

## 2018-05-18 DIAGNOSIS — R490 Dysphonia: Secondary | ICD-10-CM

## 2018-05-18 DIAGNOSIS — Z0000 Encounter for general adult medical examination without abnormal findings: Secondary | ICD-10-CM

## 2018-05-18 DIAGNOSIS — F330 Major depressive disorder, recurrent, mild: Secondary | ICD-10-CM

## 2018-05-18 DIAGNOSIS — F17200 Nicotine dependence, unspecified, uncomplicated: Secondary | ICD-10-CM

## 2018-05-18 DIAGNOSIS — I10 Essential (primary) hypertension: Secondary | ICD-10-CM

## 2018-05-18 DIAGNOSIS — Z125 Encounter for screening for malignant neoplasm of prostate: Secondary | ICD-10-CM

## 2018-05-18 DIAGNOSIS — M1A9XX0 Chronic gout, unspecified, without tophus (tophi): Secondary | ICD-10-CM

## 2018-05-18 DIAGNOSIS — F419 Anxiety disorder, unspecified: Secondary | ICD-10-CM

## 2018-05-18 DIAGNOSIS — Z131 Encounter for screening for diabetes mellitus: Secondary | ICD-10-CM

## 2018-05-18 DIAGNOSIS — K2100 Gastro-esophageal reflux disease with esophagitis, without bleeding: Secondary | ICD-10-CM

## 2018-05-18 DIAGNOSIS — Z1211 Encounter for screening for malignant neoplasm of colon: Secondary | ICD-10-CM

## 2018-05-18 DIAGNOSIS — Z13220 Encounter for screening for lipoid disorders: Secondary | ICD-10-CM

## 2018-05-18 DIAGNOSIS — R748 Abnormal levels of other serum enzymes: Secondary | ICD-10-CM

## 2018-05-18 DIAGNOSIS — F1010 Alcohol abuse, uncomplicated: Secondary | ICD-10-CM

## 2018-05-18 MED ORDER — HYDROXYZINE HCL 10 MG PO TABS: 10.0000 mg | ORAL_TABLET | Freq: Three times a day (TID) | ORAL | 0 refills | Status: DC | PRN

## 2018-05-18 MED ORDER — BUPROPION HCL ER (SR) 150 MG PO TB12: ORAL_TABLET | ORAL | 1 refills | Status: DC

## 2018-05-18 MED ORDER — ESOMEPRAZOLE MAGNESIUM 40 MG PO CPDR: 40.0000 mg | DELAYED_RELEASE_CAPSULE | Freq: Every day | ORAL | 4 refills | Status: DC

## 2018-05-18 MED ORDER — ALLOPURINOL 100 MG PO TABS
ORAL_TABLET | ORAL | 4 refills | Status: DC
Start: 2018-05-18 — End: 2019-06-05

## 2018-05-18 MED ORDER — VARENICLINE TARTRATE 0.5 MG X 11 & 1 MG X 42 PO MISC: ORAL | 0 refills | Status: DC

## 2018-05-18 MED ORDER — METOPROLOL TARTRATE 50 MG PO TABS: ORAL_TABLET | ORAL | 4 refills | Status: DC

## 2018-05-18 NOTE — Patient Instructions (Addendum)
Would like for you to come back for spirometry.  Start wellbutrin for anxiety.  Vistaril as needed.  Health Maintenance, Male A healthy lifestyle and preventive care is important for your health and wellness. Ask your health care provider about what schedule of regular examinations is right for you. What should I know about weight and diet? Eat a Healthy Diet  Eat plenty of vegetables, fruits, whole grains, low-fat dairy products, and lean protein.  Do not eat a lot of foods high in solid fats, added sugars, or salt.  Maintain a Healthy Weight Regular exercise can help you achieve or maintain a healthy weight. You should:  Do at least 150 minutes of exercise each week. The exercise should increase your heart rate and make you sweat (moderate-intensity exercise).  Do strength-training exercises at least twice a week.  Watch Your Levels of Cholesterol and Blood Lipids  Have your blood tested for lipids and cholesterol every 5 years starting at 53 years of age. If you are at high risk for heart disease, you should start having your blood tested when you are 53 years old. You may need to have your cholesterol levels checked more often if: ? Your lipid or cholesterol levels are high. ? You are older than 53 years of age. ? You are at high risk for heart disease.  What should I know about cancer screening? Many types of cancers can be detected early and may often be prevented. Lung Cancer  You should be screened every year for lung cancer if: ? You are a current smoker who has smoked for at least 30 years. ? You are a former smoker who has quit within the past 15 years.  Talk to your health care provider about your screening options, when you should start screening, and how often you should be screened.  Colorectal Cancer  Routine colorectal cancer screening usually begins at 53 years of age and should be repeated every 5-10 years until you are 53 years old. You may need to be  screened more often if early forms of precancerous polyps or small growths are found. Your health care provider may recommend screening at an earlier age if you have risk factors for colon cancer.  Your health care provider may recommend using home test kits to check for hidden blood in the stool.  A small camera at the end of a tube can be used to examine your colon (sigmoidoscopy or colonoscopy). This checks for the earliest forms of colorectal cancer.  Prostate and Testicular Cancer  Depending on your age and overall health, your health care provider may do certain tests to screen for prostate and testicular cancer.  Talk to your health care provider about any symptoms or concerns you have about testicular or prostate cancer.  Skin Cancer  Check your skin from head to toe regularly.  Tell your health care provider about any new moles or changes in moles, especially if: ? There is a change in a mole's size, shape, or color. ? You have a mole that is larger than a pencil eraser.  Always use sunscreen. Apply sunscreen liberally and repeat throughout the day.  Protect yourself by wearing long sleeves, pants, a wide-brimmed hat, and sunglasses when outside.  What should I know about heart disease, diabetes, and high blood pressure?  If you are 5818-53 years of age, have your blood pressure checked every 3-5 years. If you are 53 years of age or older, have your blood pressure checked every year.  You should have your blood pressure measured twice-once when you are at a hospital or clinic, and once when you are not at a hospital or clinic. Record the average of the two measurements. To check your blood pressure when you are not at a hospital or clinic, you can use: ? An automated blood pressure machine at a pharmacy. ? A home blood pressure monitor.  Talk to your health care provider about your target blood pressure.  If you are between 41-70 years old, ask your health care provider if you  should take aspirin to prevent heart disease.  Have regular diabetes screenings by checking your fasting blood sugar level. ? If you are at a normal weight and have a low risk for diabetes, have this test once every three years after the age of 104. ? If you are overweight and have a high risk for diabetes, consider being tested at a younger age or more often.  A one-time screening for abdominal aortic aneurysm (AAA) by ultrasound is recommended for men aged 65-75 years who are current or former smokers. What should I know about preventing infection? Hepatitis B If you have a higher risk for hepatitis B, you should be screened for this virus. Talk with your health care provider to find out if you are at risk for hepatitis B infection. Hepatitis C Blood testing is recommended for:  Everyone born from [birth year] through 1966.  Anyone with known risk factors for hepatitis C.  Sexually Transmitted Diseases (STDs)  You should be screened each year for STDs including gonorrhea and chlamydia if: ? You are sexually active and are younger than 53 years of age. ? You are older than 53 years of age and your health care provider tells you that you are at risk for this type of infection. ? Your sexual activity has changed since you were last screened and you are at an increased risk for chlamydia or gonorrhea. Ask your health care provider if you are at risk.  Talk with your health care provider about whether you are at high risk of being infected with HIV. Your health care provider may recommend a prescription medicine to help prevent HIV infection.  What else can I do?  Schedule regular health, dental, and eye exams.  Stay current with your vaccines (immunizations).  Do not use any tobacco products, such as cigarettes, chewing tobacco, and e-cigarettes. If you need help quitting, ask your health care provider.  Limit alcohol intake to no more than 2 drinks per day. One drink equals 12 ounces of beer,  5 ounces of wine, or 1 ounces of hard liquor.  Do not use street drugs.  Do not share needles.  Ask your health care provider for help if you need support or information about quitting drugs.  Tell your health care provider if you often feel depressed.  Tell your health care provider if you have ever been abused or do not feel safe at home. This information is not intended to replace advice given to you by your health care provider. Make sure you discuss any questions you have with your health care provider. Document Released: 02/15/2008 Document Revised: 04/17/2016 Document Reviewed: 05/23/2015 Elsevier Interactive Patient Education  2018 [employer]. Smoking Tobacco Information Smoking tobacco will very likely harm your health. Tobacco contains a poisonous (toxic), colorless chemical called nicotine. Nicotine affects the brain and makes tobacco addictive. This change in your brain can make it hard to stop smoking. Tobacco also has other toxic chemicals  that can hurt your body and raise your risk of many cancers. How can smoking tobacco affect me? Smoking tobacco can increase your chances of having serious health conditions, such as:  Cancer. Smoking is most commonly associated with lung cancer, but can lead to cancer in other parts of the body.  Chronic obstructive pulmonary disease (COPD). This is a long-term lung condition that makes it hard to breathe. It also gets worse over time.  High blood pressure (hypertension), heart disease, stroke, or heart attack.  Lung infections, such as pneumonia.  Cataracts. This is when the lenses in the eyes become clouded.  Digestive problems. This may include peptic ulcers, heartburn, and gastroesophageal reflux disease (GERD).  Oral health problems, such as gum disease and tooth loss.  Loss of taste and smell.  Smoking can affect your appearance by causing:  Wrinkles.  Yellow or stained teeth, fingers, and fingernails.  Smoking  tobacco can also affect your social life.  Many workplaces, [employer], hotels, and public places are tobacco-free. This means that you may experience challenges in finding places to smoke when away from home.  The cost of a smoking habit can be expensive. Expenses for someone who smokes come in two ways: ? You spend money on a regular basis to buy tobacco. ? Your health care costs in the long-term are higher if you smoke.  Tobacco smoke can also affect the health of those around you. Children of smokers have greater chances of: ? Sudden infant death syndrome (SIDS). ? Ear infections. ? Lung infections.  What lifestyle changes can be made?  Do not start smoking. Quit if you already do.  To quit smoking: ? Make a plan to quit smoking and commit yourself to it. Look for programs to help you and ask your health care provider for recommendations and ideas. ? Talk with your health care provider about using nicotine replacement medicines to help you quit. Medicine replacement medicines include gum, lozenges, patches, sprays, or pills. ? Do not replace cigarette smoking with electronic cigarettes, which are commonly called e-cigarettes. The safety of e-cigarettes is not known, and some may contain harmful chemicals. ? Avoid places, people, or situations that tempt you to smoke. ? If you try to quit but return to smoking, don't give up hope. It is very common for people to try a number of times before they fully succeed. When you feel ready again, give it another try.  Quitting smoking might affect the way you eat as well as your weight. Be prepared to monitor your eating habits. Get support in planning and following a healthy diet.  Ask your health care provider about having regular tests (screenings) to check for cancer. This may include blood tests, imaging tests, and other tests.  Exercise regularly. Consider taking walks, joining a gym, or doing yoga or exercise classes.  Develop skills  to manage your stress. These skills include meditation. What are the benefits of quitting smoking? By quitting smoking, you may:  Lower your risk of getting cancer and other diseases caused by smoking.  Live longer.  Breathe better.  Lower your blood pressure and heart rate.  Stop your addiction to tobacco.  Stop creating secondhand smoke that hurts other people.  Improve your sense of taste and smell.  Look better over time, due to having fewer wrinkles and less staining.  What can happen if changes are not made? If you do not stop smoking, you may:  Get cancer and other diseases.  Develop COPD or  other long-term (chronic) lung conditions.  Develop serious problems with your heart and blood vessels (cardiovascular system).  Need more tests to screen for problems caused by smoking.  Have higher, long-term healthcare costs from medicines or treatments related to smoking.  Continue to have worsening changes in your lungs, mouth, and nose.  Where to find support: To get support to quit smoking, consider:  Asking your health care provider for more information and resources.  Taking classes to learn more about quitting smoking.  Looking for local organizations that offer resources about quitting smoking.  Joining a support group for people who want to quit smoking in your local community.  Where to find more information: You may find more information about quitting smoking from:  HelpGuide.org: www.helpguide.org/articles/addictions/how-to-quit-smoking.htm  [employer]: smokefree.gov  American Lung Association: www.lung.org  Contact a health care provider if:  You have problems breathing.  Your lips, nose, or fingers turn blue.  You have chest pain.  You are coughing up blood.  You feel faint or you pass out.  You have other noticeable changes that cause you to worry. Summary  Smoking tobacco can negatively affect your health, the health of those  around you, your finances, and your social life.  Do not start smoking. Quit if you already do. If you need help quitting, ask your health care provider.  Think about joining a support group for people who want to quit smoking in your local community. There are many effective programs that will help you to quit this behavior. This information is not intended to replace advice given to you by your health care provider. Make sure you discuss any questions you have with your health care provider. Document Released: 09/03/2016 Document Revised: 09/03/2016 Document Reviewed: 09/03/2016 Elsevier Interactive Patient Education  [employer].

## 2018-05-18 NOTE — Progress Notes (Signed)
Subjective:    Patient ID: Michael Meza, male    DOB: [birth year], 52 y.o.   MRN: [id number]  HPI  Pt is a 52 yo male with HTN, gout, GERD, depression, anxiety, current smoker who presents to the clinic for CPE.   He has a lot of anxiety around health right now. He "wants to be checked out".   He is a current smoker for last 30 years at least 2 packs a day. He has a chronic cough. Denies any SOB or wheezing. He denies his SOB limits him doing anything.  He had the episode of hoarseness and has mostly come back but still leaves him worried and would like referral. He wants to quit smoking. He did have CT of chest on 04/07/2017 normal.   .. Active Ambulatory Problems    Diagnosis Date Noted  . Tobacco abuse 03/02/2014  . Gout 03/07/2014  . Essential hypertension, benign 03/07/2014  . Anxiety 08/31/2014  . Depression 08/31/2014  . Alcohol abuse 10/30/2016  . Arthritis of knee, left 02/25/2017  . Hemoptysis 03/24/2017  . Pleural effusion 03/24/2017  . Dry heaves 10/05/2017  . Upper abdominal pain 10/05/2017  . Gastroesophageal reflux disease with esophagitis 10/05/2017  . Hoarseness 10/05/2017  . Wheezing 10/05/2017  . Current smoker 10/05/2017   Resolved Ambulatory Problems    Diagnosis Date Noted  . Acute gout 03/07/2014  . Pain in the chest 03/07/2014  . Right foot pain 06/14/2014   No Additional Past Medical History   .Marland Kitchen. Social History   Socioeconomic History  . Marital status: Single    Spouse name: Not on file  . Number of children: Not on file  . Years of education: Not on file  . Highest education level: Not on file  Occupational History  . Occupation: truck [employer]  Social Needs  . Financial resource strain: Not on file  . Food insecurity:    Worry: Not on file    Inability: Not on file  . Transportation needs:    Medical: Not on file    Non-medical: Not on file  Tobacco Use  . Smoking status: Current Every Day Smoker    Packs/day: 2.00    Years:  30.00    Pack years: 60.00  . Smokeless tobacco: Never Used  Substance and Sexual Activity  . Alcohol use: Yes    Alcohol/week: 21.0 standard drinks    Types: 21 Cans of beer per week    Comment: pt reports he has cut back a lot.   . Drug use: No  . Sexual activity: Yes  Lifestyle  . Physical activity:    Days per week: Not on file    Minutes per session: Not on file  . Stress: Not on file  Relationships  . Social connections:    Talks on phone: Not on file    Gets together: Not on file    Attends religious service: Not on file    Active member of club or organization: Not on file    Attends meetings of clubs or organizations: Not on file    Relationship status: Not on file  . Intimate partner violence:    Fear of current or ex partner: Not on file    Emotionally abused: Not on file    Physically abused: Not on file    Forced sexual activity: Not on file  Other Topics Concern  . Not on file  Social History Narrative  . Not on file  Review of Systems    see HPI.  Objective:   Physical Exam BP 121/86   Pulse 78   Ht 5\' 11"  (1.803 m)   Wt 194 lb (88 kg)   BMI 27.06 kg/m   General Appearance:    Alert, cooperative, no distress, appears stated age  Head:    Normocephalic, without obvious abnormality, atraumatic  Eyes:    PERRL, conjunctiva/corneas clear, EOM's intact, fundi    benign, both eyes       Ears:    Normal TM's and external ear canals, both ears  Nose:   Nares normal, septum midline, mucosa normal, no drainage    or sinus tenderness  Throat:   Lips, mucosa, and tongue normal; teeth and gums normal  Neck:   Supple, symmetrical, trachea midline, no adenopathy;       thyroid:  No enlargement/tenderness/nodules; no carotid   bruit or JVD  Back:     Symmetric, no curvature, ROM normal, no CVA tenderness  Lungs:     Clear to auscultation bilaterally, respirations unlabored  Chest wall:    No tenderness or deformity  Heart:    Regular rate and rhythm,  S1 and S2 normal, no murmur, rub   or gallop  Abdomen:     Soft, non-tender, bowel sounds active all four quadrants,    no masses, no organomegaly     Rectal:    Normal tone, normal prostate, no masses or tenderness;   guaiac negative stool  Extremities:   Extremities normal, atraumatic, no cyanosis or edema  Pulses:   2+ and symmetric all extremities  Skin:   Skin color, texture, turgor normal, no rashes or lesions  Lymph nodes:   Cervical, supraclavicular, and axillary nodes normal  Neurologic:   CNII-XII intact. Normal strength, sensation and reflexes      throughout         Assessment & Plan:  Marland KitchenMarland KitchenDiagnoses and all orders for this visit:  Routine physical examination -     Lipid Panel w/reflex Direct LDL -     COMPLETE METABOLIC PANEL WITH GFR -     PSA -     CBC  Essential hypertension, benign  Gastroesophageal reflux disease with esophagitis -     esomeprazole (NEXIUM) 40 MG capsule; Take 1 capsule (40 mg total) by mouth daily.  Anxiety -     buPROPion (WELLBUTRIN SR) 150 MG 12 hr tablet; Take one tablet daily and then increase to twice a day. -     hydrOXYzine (ATARAX/VISTARIL) 10 MG tablet; Take 1 tablet (10 mg total) by mouth 3 (three) times daily as needed.  Mild episode of recurrent major depressive disorder (HCC) -     buPROPion (WELLBUTRIN SR) 150 MG 12 hr tablet; Take one tablet daily and then increase to twice a day.  Current smoker -     varenicline (CHANTIX STARTING MONTH PAK) 0.5 MG X 11 & 1 MG X 42 tablet; Take one 0.5mg  tablet by mouth once daily for 3 days, then increase to one 0.5mg  tablet twice daily for 3 days, then increase to one 1mg  tablet twice daily. -     COMPLETE METABOLIC PANEL WITH GFR -     buPROPion (WELLBUTRIN SR) 150 MG 12 hr tablet; Take one tablet daily and then increase to twice a day. -     Ambulatory referral to ENT  Hoarseness -     esomeprazole (NEXIUM) 40 MG capsule; Take 1 capsule (40 mg total) by mouth daily. -  Ambulatory  referral to ENT  Essential hypertension -     metoprolol tartrate (LOPRESSOR) 50 MG tablet; TAKE 1 TABLET(50 MG) BY MOUTH TWICE DAILY  Screening for lipid disorders -     Lipid Panel w/reflex Direct LDL  Screening for diabetes mellitus -     COMPLETE METABOLIC PANEL WITH GFR  Prostate cancer screening -     PSA  Colon cancer screening -     Ambulatory referral to Gastroenterology  Chronic gout without tophus, unspecified cause, unspecified site -     allopurinol (ZYLOPRIM) 100 MG tablet; TAKE 1 TABLET(100 MG) BY MOUTH TWICE DAILY   .Marland Kitchen Depression screen PHQ 2/9 05/18/2018  Decreased Interest 1  Down, Depressed, Hopeless 0  PHQ - 2 Score 1  Altered sleeping 1  Tired, decreased energy 1  Change in appetite 0  Feeling bad or failure about yourself  1  Trouble concentrating 0  Moving slowly or fidgety/restless 0  Suicidal thoughts 0  PHQ-9 Score 4  Difficult doing work/chores Somewhat difficult   .Marland Kitchen GAD 7 : Generalized Anxiety Score 05/18/2018  Nervous, Anxious, on Edge 1  Control/stop worrying 1  Worry too much - different things 1  Trouble relaxing 0  Restless 0  Easily annoyed or irritable 0  Afraid - awful might happen 0  Total GAD 7 Score 3  Anxiety Difficulty Somewhat difficult    .Marland KitchenStart a regular exercise program and make sure you are eating a healthy diet Try to eat 4 servings of dairy a day or take a calcium supplement (500mg  twice a day). Fasting labs ordered.  PSA for prostate screening. DRE normal. AUA 4 done today in office.  Colonoscopy ordered for screening.  Your vaccines are up to date. Pt declined flu shot today.  Pt declined shingrix today.  BP look great today.   Discussed smoking cessation. Pt would like to quit. Long smoke year hx. Not 55 and does not qualify for low dose lung cancer screening. chantix given. Discussed side effects. Discussed tapering smoking over next month until he has quit and follow up in 1 month. Needs spirometry get that  set up.   Anxiety about health and other things is getting to patient. Started wellbutrin. Discussed it could help with smoking cessation as well. Vistaril given for acute anxiety as needed. Discussed take once to see if makes you sleepy.   Continues to be worried about hoarseness due to his smoking risk. He would like this evaluated. Discussed likely just inflamed vocal cords and chronic smoking changes/acid reflux and reassuring that hoarseness has almost resolved. Will refer.   Follow up in 1 month.

## 2018-05-20 ENCOUNTER — Telehealth: Payer: Self-pay | Admitting: Physician Assistant

## 2018-05-20 ENCOUNTER — Encounter: Payer: Self-pay | Admitting: Physician Assistant

## 2018-05-20 LAB — COMPLETE METABOLIC PANEL WITH GFR
AG Ratio: 1.9 (calc) (ref 1.0–2.5)
ALT: 33 U/L (ref 9–46)
AST: 42 U/L — ABNORMAL HIGH (ref 10–35)
Albumin: 4.2 g/dL (ref 3.6–5.1)
Alkaline phosphatase (APISO): 75 U/L (ref 40–115)
BUN: 7 mg/dL (ref 7–25)
CO2: 23 mmol/L (ref 20–32)
Calcium: 9.1 mg/dL (ref 8.6–10.3)
Chloride: 107 mmol/L (ref 98–110)
Creat: 0.74 mg/dL (ref 0.70–1.33)
GFR, Est African American: 122 mL/min/{1.73_m2} (ref >=60)
GFR, Est Non African American: 105 mL/min/{1.73_m2} (ref >=60)
Globulin: 2.2 g/dL (calc) (ref 1.9–3.7)
Glucose, Bld: 96 mg/dL (ref 65–99)
Potassium: 4.6 mmol/L (ref 3.5–5.3)
Sodium: 139 mmol/L (ref 135–146)
Total Bilirubin: 0.5 mg/dL (ref 0.2–1.2)
Total Protein: 6.4 g/dL (ref 6.1–8.1)

## 2018-05-20 LAB — CBC
HCT: 48 % (ref 38.5–50.0)
Hemoglobin: 16.8 g/dL (ref 13.2–17.1)
MCH: 34.9 pg — ABNORMAL HIGH (ref 27.0–33.0)
MCHC: 35 g/dL (ref 32.0–36.0)
MCV: 99.6 fL (ref 80.0–100.0)
MPV: 10.5 fL (ref 7.5–12.5)
Platelets: 191 10*3/uL (ref 140–400)
RBC: 4.82 10*6/uL (ref 4.20–5.80)
RDW: 13 % (ref 11.0–15.0)
WBC: 6.3 10*3/uL (ref 3.8–10.8)

## 2018-05-20 LAB — LIPID PANEL W/REFLEX DIRECT LDL
Cholesterol: 122 mg/dL (ref <200)
HDL: 61 mg/dL (ref >40)
LDL Cholesterol (Calc): 48 mg/dL (calc)
Non-HDL Cholesterol (Calc): 61 mg/dL (calc) (ref <130)
Total CHOL/HDL Ratio: 2 (calc) (ref <5.0)
Triglycerides: 54 mg/dL (ref <150)

## 2018-05-20 LAB — PSA: PSA: 0.7 ng/mL (ref <=4.0)

## 2018-05-20 NOTE — Progress Notes (Signed)
Call pt: your cholesterol looks fantastic. Your sugars look wonderful. Kidney function great. One liver enzyme a little elevated BUT better than past readings. Likely due to alcohol use. If you wanted to see could stop alcohol for 2 weeks and then have rechecked. I don't see where any hepatitis panels were done in past. We could order those and a liver u/s to fully evaluate.   Can we add hepatitis panel to these labs?

## 2018-05-20 NOTE — Telephone Encounter (Signed)
I noticed we had no family history on file. Could we call patient and ask if any signifcant family hx that we should document?   Also let him know he does not qualify for the low dose lung cancer screening until he is 54. That was not ordered.

## 2018-05-28 NOTE — Progress Notes (Signed)
Liver u/s ordered  

## 2018-05-28 NOTE — Addendum Note (Signed)
Addended by: Jomarie Longs on: 05/28/2018 11:55 AM   Modules accepted: Orders

## 2018-06-15 ENCOUNTER — Ambulatory Visit (HOSPITAL_COMMUNITY): Payer: BLUE CROSS/BLUE SHIELD

## 2018-06-22 ENCOUNTER — Ambulatory Visit: Payer: BLUE CROSS/BLUE SHIELD | Admitting: Physician Assistant

## 2018-07-06 ENCOUNTER — Ambulatory Visit: Payer: BLUE CROSS/BLUE SHIELD | Admitting: Physician Assistant

## 2018-08-28 NOTE — Telephone Encounter (Signed)
Attempted to contact patient to verify family history, but the phone line kept ringing and the line kept cutting off. Will attempt to contact patient again at a later time.

## 2018-10-23 ENCOUNTER — Encounter: Payer: Self-pay | Admitting: Physician Assistant

## 2018-10-23 DIAGNOSIS — J381 Polyp of vocal cord and larynx: Secondary | ICD-10-CM | POA: Insufficient documentation

## 2019-06-04 ENCOUNTER — Other Ambulatory Visit: Payer: Self-pay | Admitting: Physician Assistant

## 2019-06-04 DIAGNOSIS — M1A9XX0 Chronic gout, unspecified, without tophus (tophi): Secondary | ICD-10-CM

## 2019-06-04 NOTE — Telephone Encounter (Signed)
[facility] pharmacy requesting med refill for allopurinol. Last visit was 05/20/18 and last lab check 05/19/18. Pls advise if refill is appropriate. Thanks.

## 2019-07-26 ENCOUNTER — Other Ambulatory Visit: Payer: Self-pay | Admitting: Physician Assistant

## 2019-07-26 DIAGNOSIS — F1010 Alcohol abuse, uncomplicated: Secondary | ICD-10-CM

## 2019-07-26 DIAGNOSIS — R748 Abnormal levels of other serum enzymes: Secondary | ICD-10-CM

## 2019-08-03 ENCOUNTER — Other Ambulatory Visit: Payer: Self-pay | Admitting: Physician Assistant

## 2019-08-03 DIAGNOSIS — I10 Essential (primary) hypertension: Secondary | ICD-10-CM

## 2019-10-22 ENCOUNTER — Encounter: Payer: Self-pay | Admitting: Emergency Medicine

## 2019-10-22 ENCOUNTER — Emergency Department (INDEPENDENT_AMBULATORY_CARE_PROVIDER_SITE_OTHER)
Admission: EM | Admit: 2019-10-22 | Discharge: 2019-10-22 | Disposition: A | Discharge status: Home / Self Care | Payer: Commercial Managed Care - PPO | Source: Home / Self Care | Attending: Family Medicine | Admitting: Family Medicine

## 2019-10-22 ENCOUNTER — Other Ambulatory Visit: Payer: Self-pay

## 2019-10-22 ENCOUNTER — Emergency Department (INDEPENDENT_AMBULATORY_CARE_PROVIDER_SITE_OTHER): Payer: Commercial Managed Care - PPO

## 2019-10-22 DIAGNOSIS — J069 Acute upper respiratory infection, unspecified: Secondary | ICD-10-CM

## 2019-10-22 DIAGNOSIS — J441 Chronic obstructive pulmonary disease with (acute) exacerbation: Secondary | ICD-10-CM

## 2019-10-22 DIAGNOSIS — R509 Fever, unspecified: Secondary | ICD-10-CM

## 2019-10-22 MED ORDER — PREDNISONE 20 MG PO TABS: ORAL_TABLET | ORAL | 0 refills | Status: DC

## 2019-10-22 MED ORDER — DOXYCYCLINE HYCLATE 100 MG PO CAPS: 100.0000 mg | ORAL_CAPSULE | Freq: Two times a day (BID) | ORAL | 0 refills | Status: DC

## 2019-10-22 NOTE — Discharge Instructions (Addendum)
If cough develops, take plain guaifenesin (1200mg  extended release tabs such as Mucinex) twice daily, with plenty of water. Get adequate rest.   May take Delsym Cough Suppressant at bedtime for nighttime cough. Try warm salt water gargles for sore throat.  Stop all antihistamines for now, and other non-prescription cough/cold preparations. May take Tylenol as needed for fever, body aches, etc.   Isolate yourself until COVID-19 test result is available.   If your COVID19 test is positive, then you are infected with the novel coronavirus and could give the virus to others.  Please continue isolation at home for at least 10 days since the start of your symptoms. Once you complete your 10 day quarantine, you may return to normal activities as long as you've not had a fever for over 24 hours (without taking fever reducing medicine) and your symptoms are improving. Please continue good preventive care measures, including:  frequent hand-washing, avoid touching your face, cover coughs/sneezes, stay out of crowds and keep a 6 foot distance from others.  Go to the nearest hospital emergency room if fever/cough/breathlessness are severe or illness seems like a threat to life.

## 2019-10-22 NOTE — ED Provider Notes (Signed)
[facility] Meza C[facility]    CSN: [id number] Arrival date & time: 10/22/19  1419      History   Chief Complaint Chief Complaint  Patient presents with  . Generalized Body Aches  . Fever    unknown temp - no thermometer    HPI Michael Meza is a 54 y.o. male.   Five days ago patient developed fatigue, sweats, myalgias, and mild sore throat.  He has had vague pain in his right anterior chest with inspiration.  He notes that his sense of taste has changed.  Yesterday he had fever. Patient had pneumonia about 5 to 6 years ago and continues to smoke.  The history is provided by the patient.    Past Medical History:  Diagnosis Date  . Alcohol abuse 10/30/2016  . Depression 08/31/2014  . Essential hypertension, benign 03/07/2014  . Gout 03/07/2014   Not currently on treatment for prevention.      Patient Active Problem List   Diagnosis Date Noted  . 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 Dry heaves 10/05/2017  . Upper abdominal pain 10/05/2017  . Gastroesophageal reflux disease with esophagitis 10/05/2017  . Hoarseness 10/05/2017  . Wheezing 10/05/2017  . Current smoker 10/05/2017  . Hemoptysis 03/24/2017  . Pleural effusion 03/24/2017  . Arthritis of knee, left 02/25/2017  . Alcohol abuse 10/30/2016  . Anxiety 08/31/2014  . Depression 08/31/2014  . Gout 03/07/2014  . Essential hypertension, benign 03/07/2014  . Tobacco abuse 03/02/2014    Past surgical history:  Cataract extraction.  Appendectomy age 48     Home Medications    Prior to Admission medications   Medication Sig Start Date End Date Taking? Authorizing Provider  allopurinol (ZYLOPRIM) 100 MG tablet TAKE 1 TABLET(100 MG) BY MOUTH TWICE DAILY 06/05/19  Yes Breeback, [name] L, PA-C  metoprolol tartrate (LOPRESSOR) 50 MG tablet TAKE 1 TABLET BY MOUTH TWICE DAILY/ NEEDS APPT/LABS 08/03/19  Yes Breeback, [name] L, PA-C  buPROPion (WELLBUTRIN SR) 150 MG 12 hr tablet Take one tablet daily and then increase to twice a  day. 05/18/18   Breeback, Royetta Car, PA-C  Colchicine (MITIGARE) 0.6 MG CAPS Take 1 tablet by mouth daily. 10/31/17   Breeback, [name] L, PA-C  diclofenac sodium (VOLTAREN) 1 % GEL APP 4 GRAMS TO AFFECTED JOINT QID 03/02/17   [provider]  doxycycline (VIBRAMYCIN) 100 MG capsule Take 1 capsule (100 mg total) by mouth 2 (two) times daily. Take with food. 10/22/19   Kandra Nicolas, MD  esomeprazole (NEXIUM) 40 MG capsule Take 1 capsule (40 mg total) by mouth daily. 05/18/18   Breeback, [name] L, PA-C  hydrOXYzine (ATARAX/VISTARIL) 10 MG tablet Take 1 tablet (10 mg total) by mouth 3 (three) times daily as needed. 05/18/18   Breeback, Royetta Car, PA-C  predniSONE (DELTASONE) 20 MG tablet Take one tab by mouth twice daily for 4 days, then one daily. Take with food. 10/22/19   Kandra Nicolas, MD  varenicline (CHANTIX STARTING MONTH PAK) 0.5 MG X 11 & 1 MG X 42 tablet Take one 0.5mg  tablet by mouth once daily for 3 days, then increase to one 0.5mg  tablet twice daily for 3 days, then increase to one 1mg  tablet twice daily. 05/18/18   Donella Stade, PA-C    Family History Family History  Problem Relation Age of Onset  . Diabetes Brother     Social History Social History   Tobacco Use  . Smoking status: Current Every Day Smoker  Packs/day: 2.00    Years: 30.00    Pack years: 60.00  . Smokeless tobacco: Never Used  Substance Use Topics  . Alcohol use: Yes    Alcohol/week: 21.0 standard drinks    Types: 21 Cans of beer per week    Comment: pt reports he has cut back a lot.   . Drug use: No     Allergies   Prozac [fluoxetine hcl]   Review of Systems Review of Systems + sore throat No cough ? pleuritic pain right anterior chest ? wheezing No nasal congestion ? post-nasal drainage No sinus pain/pressure No itchy/red eyes No earache No hemoptysis No SOB + fever, + chills/sweats No nausea No vomiting No abdominal pain No diarrhea No urinary symptoms No skin rash + fatigue +  myalgias No headache Used OTC meds (Theraflu) without relief   Physical Exam Triage Vital Signs ED Triage Vitals  Enc Vitals Group     BP 10/22/19 1452 (!) 137/91     Pulse Rate 10/22/19 1452 88     Resp 10/22/19 1452 18     Temp 10/22/19 1452 99.6 F (37.6 C)     Temp Source 10/22/19 1452 Oral     SpO2 10/22/19 1452 99 %     Weight 10/22/19 1453 185 lb (83.9 kg)     Height 10/22/19 1453 5\' 11"  (1.803 m)     Head Circumference --      Peak Flow --      Pain Score 10/22/19 1453 7     Pain Loc --      Pain Edu? --      Excl. in GC? --    No data found.  Updated Vital Signs BP (!) 137/91 (BP Location: Left Arm)   Pulse 88   Temp 99.6 F (37.6 C) (Oral)   Resp 18   Ht 5\' 11"  (1.803 m)   Wt 83.9 kg   SpO2 99%   BMI 25.80 kg/m   Visual Acuity Right Eye Distance:   Left Eye Distance:   Bilateral Distance:    Right Eye Near:   Left Eye Near:    Bilateral Near:     Physical Exam Nursing notes and Vital Signs reviewed. Appearance:  Patient appears stated age, and in no acute distress Eyes:  Pupils are equal, round, and reactive to light and accomodation.  Extraocular movement is intact.  Conjunctivae are not inflamed  Ears:  Canals normal.  Tympanic membranes normal.  Nose:  Mildly congested turbinates.  No sinus tenderness.   Pharynx:  Normal Neck:  Supple.  Mildly enlarged lateral nodes are present, tender to palpation on the left.   Lungs:  Bibasilar anterior wheezes present.  Breath sounds are equal.  Moving air well. Heart:  Regular rate and rhythm without murmurs, rubs, or gallops.  Abdomen:  Nontender without masses or hepatosplenomegaly.  Bowel sounds are present.  No CVA or flank tenderness.  Extremities:  No edema.  Skin:  No rash present.   UC Treatments / Results  Labs (all labs ordered are listed, but only abnormal results are displayed) Labs Reviewed  NOVEL CORONAVIRUS, NAA    EKG   Radiology DG Chest 2 View  Result Date:  10/22/2019 CLINICAL DATA:  54 year-old male c/o URI for 5 days with persistent fever and pain right anterior chest. Pt is being tested for COVID today. Current smoker EXAM: CHEST - 2 VIEW COMPARISON:  03/24/2017 FINDINGS: Cardiac silhouette normal in size. No mediastinal or hilar  masses. No evidence of adenopathy. Lungs are hyperexpanded. There are prominent bronchovascular markings. Lungs otherwise clear. No pleural effusion or pneumothorax. Skeletal structures are intact. IMPRESSION: 1. No acute cardiopulmonary disease. 2. Hyperexpanded lungs suggests COPD. Electronically Signed   By: Amie Portland M.D.   On: 10/22/2019 16:18    Procedures Procedures (including critical care time)  Medications Ordered in UC Medications - No data to display  Initial Impression / Assessment and Plan / UC Course  I have reviewed the triage vital signs and the nursing notes.  Pertinent labs & imaging results that were available during my care of the patient were reviewed by me and considered in my medical decision making (see chart for details).    Suspect COVID19.  Send out test pending. Because of past history of pneumonia, will begin empiric doxycycline and prednisone burst/taper.    Final Clinical Impressions(s) / UC Diagnoses   Final diagnoses:  Fever, unspecified  Viral URI  COPD with exacerbation (HCC)     Discharge Instructions     If cough develops, take plain guaifenesin (1200mg  extended release tabs such as Mucinex) twice daily, with plenty of water. Get adequate rest.   May take Delsym Cough Suppressant at bedtime for nighttime cough. Try warm salt water gargles for sore throat.  Stop all antihistamines for now, and other non-prescription cough/cold preparations. May take Tylenol as needed for fever, body aches, etc.   Isolate yourself until COVID-19 test result is available.   If your COVID19 test is positive, then you are infected with the novel coronavirus and could give the virus  to others.  Please continue isolation at home for at least 10 days since the start of your symptoms. Once you complete your 10 day quarantine, you may return to normal activities as long as you've not had a fever for over 24 hours (without taking fever reducing medicine) and your symptoms are improving. Please continue good preventive care measures, including:  frequent hand-washing, avoid touching your face, cover coughs/sneezes, stay out of crowds and keep a 6 foot distance from others.  Go to the nearest hospital emergency room if fever/cough/breathlessness are severe or illness seems like a threat to life.     ED Prescriptions    Medication Sig Dispense Auth. Provider   doxycycline (VIBRAMYCIN) 100 MG capsule Take 1 capsule (100 mg total) by mouth 2 (two) times daily. Take with food. 20 capsule , MD   predniSONE (DELTASONE) 20 MG tablet Take one tab by mouth twice daily for 4 days, then one daily. Take with food. 12 tablet Lattie Haw, MD        Lattie Haw, MD 10/23/19 [phone]

## 2019-10-22 NOTE — ED Triage Notes (Signed)
C/o of general aches &pains since Monday, metal taste w/ food, fever- no thermometer at home

## 2019-10-23 LAB — NOVEL CORONAVIRUS, NAA: SARS-CoV-2, NAA: NOT DETECTED

## 2019-11-24 ENCOUNTER — Other Ambulatory Visit: Payer: Self-pay | Admitting: Physician Assistant

## 2019-11-24 DIAGNOSIS — I10 Essential (primary) hypertension: Secondary | ICD-10-CM

## 2019-11-24 NOTE — Telephone Encounter (Signed)
Must make appointment 

## 2019-12-02 ENCOUNTER — Encounter: Payer: Self-pay | Admitting: Family Medicine

## 2019-12-02 ENCOUNTER — Other Ambulatory Visit: Payer: Self-pay | Admitting: Family Medicine

## 2019-12-02 ENCOUNTER — Ambulatory Visit: Payer: Commercial Managed Care - PPO | Admitting: Family Medicine

## 2019-12-02 ENCOUNTER — Other Ambulatory Visit: Payer: Self-pay

## 2019-12-02 ENCOUNTER — Ambulatory Visit (INDEPENDENT_AMBULATORY_CARE_PROVIDER_SITE_OTHER): Payer: Commercial Managed Care - PPO

## 2019-12-02 VITALS — BP 168/97 | HR 92 | Ht 71.0 in | Wt 195.0 lb

## 2019-12-02 DIAGNOSIS — N50812 Left testicular pain: Secondary | ICD-10-CM

## 2019-12-02 DIAGNOSIS — K4090 Unilateral inguinal hernia, without obstruction or gangrene, not specified as recurrent: Secondary | ICD-10-CM

## 2019-12-02 MED ORDER — TRAMADOL HCL 50 MG PO TABS: 50.0000 mg | ORAL_TABLET | Freq: Three times a day (TID) | ORAL | 0 refills | Status: AC | PRN

## 2019-12-02 NOTE — Assessment & Plan Note (Signed)
Stat US of scrotum ordered to evaluate for torsion.  He is having completed this afternoon.  No hernia noted on exam.

## 2019-12-02 NOTE — Progress Notes (Signed)
Michael Meza - 54 y.o. male MRN [id number]  Date of birth: August 24, 1966  Subjective Chief Complaint  Patient presents with  . Testicle Pain    HPI Michael Meza is a 54 y.o. male here today for same day visit.  He has complaint of acute onset of testicular pain.  Reports that he felt fine yesterday and when he woke up this morning his left testicle was painful and swollen.  Pain radiates into his lower abdomen.  He did note blue discoloration to L side of scrotum this morning. He reports that he sleep walks and isn't sure if he hit scrotum on his bed last night.  Pain and swelling has improved some throughout the day today.  He denies dysuria, fever, chills, penile discharge or changes to bowels.   ROS:  A comprehensive ROS was completed and negative except as noted per HPI  Allergies  Allergen Reactions  . Prozac [Fluoxetine Hcl]     insomnia    Past Medical History:  Diagnosis Date  . Alcohol abuse 10/30/2016  . Depression 08/31/2014  . Essential hypertension, benign 03/07/2014  . Gout 03/07/2014   Not currently on treatment for prevention.      No past surgical history on file.  Social History   Socioeconomic History  . Marital status: Single    Spouse name: Not on file  . Number of children: Not on file  . Years of education: Not on file  . Highest education level: Not on file  Occupational History  . Occupation: truck Hospital doctor  Tobacco Use  . Smoking status: Current Every Day Smoker    Packs/day: 2.00    Years: 30.00    Pack years: 60.00  . Smokeless tobacco: Never Used  Substance and Sexual Activity  . Alcohol use: Yes    Alcohol/week: 21.0 standard drinks    Types: 21 Cans of beer per week    Comment: pt reports he has cut back a lot.   . Drug use: No  . Sexual activity: Yes  Other Topics Concern  . Not on file  Social History Narrative  . Not on file   Social Determinants of Health   Financial Resource Strain:   . Difficulty of Paying Living  Expenses:   Food Insecurity:   . Worried About Programme researcher, broadcasting/film/video in the Last Year:   . Barista in the Last Year:   Transportation Needs:   . Freight forwarder (Medical):   Marland Kitchen Lack of Transportation (Non-Medical):   Physical Activity:   . Days of Exercise per Week:   . Minutes of Exercise per Session:   Stress:   . Feeling of Stress :   Social Connections:   . Frequency of Communication with Friends and Family:   . Frequency of Social Gatherings with Friends and Family:   . Attends Religious Services:   . Active Member of Clubs or Organizations:   . Attends Banker Meetings:   Marland Kitchen Marital Status:     Family History  Problem Relation Age of Onset  . Diabetes Brother     Health Maintenance  Topic Date Due  . HIV Screening  Never done  . COLONOSCOPY  Never done  . INFLUENZA VACCINE  04/02/2020  . TETANUS/TDAP  03/02/2024     ----------------------------------------------------------------------------------------------------------------------------------------------------------------------------------------------------------------- Physical Exam BP (!) 168/97   Pulse 92   Ht 5\' 11"  (1.803 m)   Wt 195 lb (88.5 kg)   BMI 27.20 kg/m  Physical Exam Constitutional:      Appearance: Normal appearance.  Eyes:     General: No scleral icterus. Cardiovascular:     Rate and Rhythm: Normal rate and regular rhythm.  Abdominal:     General: Abdomen is flat. There is no distension.     Tenderness: There is no abdominal tenderness.     Hernia: There is no hernia in the left inguinal area or right inguinal area.  Genitourinary:    Penis: Circumcised. No tenderness or swelling.      Testes:        Left: Tenderness and swelling present.     Epididymis:     Right: Not enlarged. No tenderness.     Left: Not enlarged. No tenderness.     Comments: Possible varicocele noted along L side.  Musculoskeletal:     Cervical back: Neck supple.  Neurological:      Mental Status: He is alert.     ------------------------------------------------------------------------------------------------------------------------------------------------------------------------------------------------------------------- Assessment and Plan  Left testicular pain Stat US of scrotum ordered to evaluate for torsion.  He is having completed this afternoon.  No hernia noted on exam.    No orders of the defined types were placed in this encounter.   No follow-ups on file.  30 minutes spent including pre visit preparation, review of prior notes and labs, encounter with patient, coordination of care and same day documentation.   This visit occurred during the SARS-CoV-2 public health emergency.  Safety protocols were in place, including screening questions prior to the visit, additional usage of staff PPE, and extensive cleaning of exam room while observing appropriate contact time as indicated for disinfecting solutions.

## 2019-12-06 ENCOUNTER — Encounter: Payer: Self-pay | Admitting: Physician Assistant

## 2019-12-06 ENCOUNTER — Telehealth: Payer: Self-pay | Admitting: Physician Assistant

## 2019-12-06 NOTE — Telephone Encounter (Signed)
I don't see letter either but see dates he needs letter for.

## 2019-12-06 NOTE — Telephone Encounter (Signed)
I don't see a letter either or any mention of writing a letter.

## 2019-12-06 NOTE — Telephone Encounter (Signed)
Patient states that the letter he got from Dr.Matthews at the last visit he wasn't able to print through [name]. I didn't see it under the letters tab. Patient states that he needs this for work. Please advise.

## 2019-12-07 NOTE — Telephone Encounter (Signed)
Patient was out of work all last week and doesn't have appointment til Friday with [facility] for evaluation. Letter written for last week and this week. Available to print in [place]. Mychart message sent to patient.

## 2019-12-07 NOTE — Telephone Encounter (Signed)
Left message on machine for patient to call back to let us know what dates he needs the letter for so we can write.

## 2019-12-14 ENCOUNTER — Telehealth: Payer: Self-pay | Admitting: Neurology

## 2019-12-14 DIAGNOSIS — I10 Essential (primary) hypertension: Secondary | ICD-10-CM

## 2019-12-14 MED ORDER — METOPROLOL TARTRATE 50 MG PO TABS: ORAL_TABLET | ORAL | 0 refills | Status: DC

## 2019-12-14 NOTE — Telephone Encounter (Addendum)
Patient left vm stating he saw surgeon and they released him back to work to have surgery in the future. 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 Will send, but patient need HTN follow up. Patient made aware.

## 2019-12-16 DIAGNOSIS — K4090 Unilateral inguinal hernia, without obstruction or gangrene, not specified as recurrent: Secondary | ICD-10-CM | POA: Insufficient documentation

## 2020-03-08 ENCOUNTER — Encounter: Payer: Commercial Managed Care - PPO | Admitting: Physician Assistant

## 2020-03-10 ENCOUNTER — Ambulatory Visit (INDEPENDENT_AMBULATORY_CARE_PROVIDER_SITE_OTHER): Payer: Commercial Managed Care - PPO | Admitting: Physician Assistant

## 2020-03-10 ENCOUNTER — Telehealth: Payer: Self-pay

## 2020-03-10 VITALS — BP 151/77 | HR 72 | Ht 71.0 in | Wt 204.0 lb

## 2020-03-10 DIAGNOSIS — J441 Chronic obstructive pulmonary disease with (acute) exacerbation: Secondary | ICD-10-CM

## 2020-03-10 DIAGNOSIS — Z8719 Personal history of other diseases of the digestive system: Secondary | ICD-10-CM

## 2020-03-10 DIAGNOSIS — Z7289 Other problems related to lifestyle: Secondary | ICD-10-CM

## 2020-03-10 DIAGNOSIS — Z1159 Encounter for screening for other viral diseases: Secondary | ICD-10-CM

## 2020-03-10 DIAGNOSIS — Z0000 Encounter for general adult medical examination without abnormal findings: Secondary | ICD-10-CM

## 2020-03-10 DIAGNOSIS — Z789 Other specified health status: Secondary | ICD-10-CM

## 2020-03-10 DIAGNOSIS — Z9889 Other specified postprocedural states: Secondary | ICD-10-CM

## 2020-03-10 DIAGNOSIS — F17200 Nicotine dependence, unspecified, uncomplicated: Secondary | ICD-10-CM

## 2020-03-10 DIAGNOSIS — I10 Essential (primary) hypertension: Secondary | ICD-10-CM

## 2020-03-10 DIAGNOSIS — Z13220 Encounter for screening for lipoid disorders: Secondary | ICD-10-CM

## 2020-03-10 DIAGNOSIS — Z1211 Encounter for screening for malignant neoplasm of colon: Secondary | ICD-10-CM | POA: Diagnosis not present

## 2020-03-10 DIAGNOSIS — Z125 Encounter for screening for malignant neoplasm of prostate: Secondary | ICD-10-CM

## 2020-03-10 DIAGNOSIS — F1090 Alcohol use, unspecified, uncomplicated: Secondary | ICD-10-CM

## 2020-03-10 MED ORDER — METOPROLOL TARTRATE 50 MG PO TABS: ORAL_TABLET | ORAL | 1 refills | Status: DC

## 2020-03-10 MED ORDER — ALBUTEROL SULFATE HFA 108 (90 BASE) MCG/ACT IN AERS: 2.0000 | INHALATION_SPRAY | Freq: Four times a day (QID) | RESPIRATORY_TRACT | 1 refills | Status: DC | PRN

## 2020-03-10 MED ORDER — PREDNISONE 20 MG PO TABS: ORAL_TABLET | ORAL | 0 refills | Status: DC

## 2020-03-10 MED ORDER — LOSARTAN POTASSIUM 50 MG PO TABS: 50.0000 mg | ORAL_TABLET | Freq: Every day | ORAL | 0 refills | Status: DC

## 2020-03-10 NOTE — Telephone Encounter (Signed)
Pt called stating that he was informed by [facility] pharmacy that albuterol inhaler is not covered by insurance. Rx will required prior authorizations. As per pharmacist, preferred alternatives are: Proair or Proventil.

## 2020-03-10 NOTE — Progress Notes (Signed)
Subjective:    Patient ID: Michael Meza, male    DOB: 1966-01-08, 55 y.o.   MRN: [id number]  HPI  Pt is a 54 yo male who presents to the clinic for CPE. He did have left inguinal hernia repair on 6/16. He will go back to work next week. He continues to be sore from surgery but overall no complaints.   He continues to smoke and drink alcohol regularly. He has cut back on drinking but admits to drinking anytime he is home multiple beers. He smokes 2 packs a day. He has been more SOB recently. Recent CXR showed hyperexpanded lungs. He tried chantix and did not cut down on any cravings. He has not been using anything for breathing. No fever, chills, body aches. He is really tired all the time.   .. Active Ambulatory Problems    Diagnosis Date Noted  . Tobacco abuse 03/02/2014  . Gout 03/07/2014  . Essential hypertension, benign 03/07/2014  . Anxiety 08/31/2014  . Depression 08/31/2014  . Alcohol abuse 10/30/2016  . Arthritis of knee, left 02/25/2017  . Hemoptysis 03/24/2017  . Pleural effusion 03/24/2017  . Dry heaves 10/05/2017  . Upper abdominal pain 10/05/2017  . Gastroesophageal reflux disease with esophagitis 10/05/2017  . Hoarseness 10/05/2017  . Wheezing 10/05/2017  . Current smoker 10/05/2017  . 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 Left testicular pain 12/02/2019  . Status post hernia repair 03/10/2020   Resolved Ambulatory Problems    Diagnosis Date Noted  . Acute gout 03/07/2014  . Pain in the chest 03/07/2014  . Right foot pain 06/14/2014   No Additional Past Medical History      Review of Systems  All other systems reviewed and are negative.      Objective:   Physical Exam Vitals reviewed.  Constitutional:      Appearance: Normal appearance.  HENT:     Head: Normocephalic.     Right Ear: Tympanic membrane normal.     Left Ear: Tympanic membrane normal.     Nose: Nose normal.     Mouth/Throat:     Mouth: Mucous membranes are moist.  Eyes:      Extraocular Movements: Extraocular movements intact.     Conjunctiva/sclera: Conjunctivae normal.     Pupils: Pupils are equal, round, and reactive to light.  Neck:     Vascular: No carotid bruit.  Cardiovascular:     Rate and Rhythm: Normal rate and regular rhythm.     Pulses: Normal pulses.  Pulmonary:     Effort: Pulmonary effort is normal.     Breath sounds: Wheezing present.  Abdominal:     General: Bowel sounds are normal. There is no distension.     Palpations: Abdomen is soft.     Tenderness: There is no abdominal tenderness. There is no right CVA tenderness, left CVA tenderness or guarding.  Musculoskeletal:     Cervical back: Normal range of motion.  Neurological:     General: No focal deficit present.     Mental Status: He is alert and oriented to person, place, and time.  Psychiatric:        Mood and Affect: Mood normal.    .. Depression screen [facility] 2/9 03/10/2020 05/18/2018  Decreased Interest 0 1  Down, Depressed, Hopeless 0 0  PHQ - 2 Score 0 1  Altered sleeping 0 1  Tired, decreased energy 0 1  Change in appetite 0 0  Feeling bad or failure about yourself  0 1  Trouble concentrating 0 0  Moving slowly or fidgety/restless 0 0  Suicidal thoughts 0 0  PHQ-9 Score 0 4  Difficult doing work/chores Not difficult at all Somewhat difficult   .Marland Kitchen GAD 7 : Generalized Anxiety Score 03/10/2020 05/18/2018  Nervous, Anxious, on Edge 0 1  Control/stop worrying 0 1  Worry too much - different things 0 1  Trouble relaxing 0 0  Restless 0 0  Easily annoyed or irritable 0 0  Afraid - awful might happen 0 0  Total GAD 7 Score 0 3  Anxiety Difficulty Not difficult at all Somewhat difficult    .Marland Kitchen  RO-AUA SYMPTOM    Row Name 03/10/20 1000         During the last Month   Sensation of Bladder not Empty Not at all  1 after surgery, better now     Urinate<2 hours after last Less than half the time     Mult. stop/start when voiding Less than 1 time in 5     Difficult to  postpone voiding Not at all     Weak urinary stream Not at all  1 after surgery, better now     Push/strain to begin urination Not at all     Times per night up to urinate Less than half the time       OTHER   Total Score 5                  Assessment & Plan:  Marland KitchenMarland KitchenJerimy was seen today for annual exam.  Diagnoses and all orders for this visit:  Routine physical examination -     Ambulatory referral to Gastroenterology -     Lipid Panel w/reflex Direct LDL -     COMPLETE METABOLIC PANEL WITH GFR -     PSA -     CBC -     Hepatitis C Antibody  Colon cancer screening -     Ambulatory referral to Gastroenterology  Essential hypertension, benign -     metoprolol tartrate (LOPRESSOR) 50 MG tablet; TAKE 1 TABLET BY MOUTH TWICE DAILY. -     losartan (COZAAR) 50 MG tablet; Take 1 tablet (50 mg total) by mouth daily. -     COMPLETE METABOLIC PANEL WITH GFR -     CBC  Current smoker -     CBC -     Ambulatory Referral for Lung Cancer Scre  Essential hypertension  Screening for lipid disorders -     Lipid Panel w/reflex Direct LDL  Prostate cancer screening -     PSA  Encounter for hepatitis C screening test for low risk patient -     Hepatitis C Antibody  COPD exacerbation (HCC) -     predniSONE (DELTASONE) 20 MG tablet; Take 3 tablets for 3 days, take 2 tablets for 3 days, take 1 tablet for 3 days, take 1/2 tablet for 4 days. -     albuterol (VENTOLIN HFA) 108 (90 Base) MCG/ACT inhaler; Inhale 2 puffs into the lungs every 6 (six) hours as needed.  Status post hernia repair   .Marland KitchenStart a regular exercise program and make sure you are eating a healthy diet Try to eat 4 servings of dairy a day or take a calcium supplement (500mg  twice a day). Pt declines shingrix, COVID, pneumonia.  Needs colonoscopy. Ordered placed.  Fasting labs ordered.  AUA a little high. Discussed saw palmetto. Could try flomax as well. PsA ordered.  Discussed  cutting back on alcohol a little  more. Goal no more than 4 beers a night.   BP elevated-COPD exacerbation will recheck in 2 weeks.   Pt is smoker.  Failed chantix. Did not stop any urge to smoke. Needs lung cancer screen with CT. Needs spirometry. Treated for COPD exacerbation today.  Follow up in 2 weeks.   Improving from hernia repair. Ok to go back to work. No lifting or pulling. If having problems follow up with surgeon.

## 2020-03-10 NOTE — Telephone Encounter (Signed)
They will alert patient when this is ready.

## 2020-03-10 NOTE — Telephone Encounter (Signed)
Spoke to the pharmacy and okayed them to change this to whatever the insurance covers.

## 2020-03-10 NOTE — Patient Instructions (Addendum)
Spirometry for lung function.  Will get colonoscopy.  Will get CT of lung.

## 2020-03-10 NOTE — Progress Notes (Signed)
S/p left inguinal hernia repair, saw surgeon last week - cleared to return to work if he felt comfortable Wants you to recheck hernia repair site - advise on work Also going to get DOT physical  Wants to discuss BP medication, feels like not working [employer] completed 0/0 AUA - 5

## 2020-03-10 NOTE — Telephone Encounter (Signed)
Ok for pro air.

## 2020-03-11 LAB — COMPLETE METABOLIC PANEL WITH GFR
AG Ratio: 2 (calc) (ref 1.0–2.5)
ALT: 16 U/L (ref 9–46)
AST: 21 U/L (ref 10–35)
Albumin: 4.5 g/dL (ref 3.6–5.1)
Alkaline phosphatase (APISO): 76 U/L (ref 35–144)
BUN: 10 mg/dL (ref 7–25)
CO2: 27 mmol/L (ref 20–32)
Calcium: 9.5 mg/dL (ref 8.6–10.3)
Chloride: 103 mmol/L (ref 98–110)
Creat: 0.7 mg/dL (ref 0.70–1.33)
GFR, Est African American: 123 mL/min/{1.73_m2} (ref >=60)
GFR, Est Non African American: 106 mL/min/{1.73_m2} (ref >=60)
Globulin: 2.3 g/dL (calc) (ref 1.9–3.7)
Glucose, Bld: 96 mg/dL (ref 65–99)
Potassium: 5.3 mmol/L (ref 3.5–5.3)
Sodium: 137 mmol/L (ref 135–146)
Total Bilirubin: 0.6 mg/dL (ref 0.2–1.2)
Total Protein: 6.8 g/dL (ref 6.1–8.1)

## 2020-03-11 LAB — HEPATITIS C ANTIBODY
Hepatitis C Ab: NONREACTIVE
SIGNAL TO CUT-OFF: 0.01 (ref <1.00)

## 2020-03-11 LAB — LIPID PANEL W/REFLEX DIRECT LDL
Cholesterol: 154 mg/dL (ref <200)
HDL: 71 mg/dL (ref >=40)
LDL Cholesterol (Calc): 70 mg/dL (calc)
Non-HDL Cholesterol (Calc): 83 mg/dL (calc) (ref <130)
Total CHOL/HDL Ratio: 2.2 (calc) (ref <5.0)
Triglycerides: 51 mg/dL (ref <150)

## 2020-03-11 LAB — PSA: PSA: 0.7 ng/mL (ref <=4.0)

## 2020-03-11 LAB — CBC
HCT: 48.5 % (ref 38.5–50.0)
Hemoglobin: 16.8 g/dL (ref 13.2–17.1)
MCH: 34.7 pg — ABNORMAL HIGH (ref 27.0–33.0)
MCHC: 34.6 g/dL (ref 32.0–36.0)
MCV: 100.2 fL — ABNORMAL HIGH (ref 80.0–100.0)
MPV: 10 fL (ref 7.5–12.5)
Platelets: 248 10*3/uL (ref 140–400)
RBC: 4.84 10*6/uL (ref 4.20–5.80)
RDW: 13.2 % (ref 11.0–15.0)
WBC: 7.6 10*3/uL (ref 3.8–10.8)

## 2020-03-13 ENCOUNTER — Ambulatory Visit (INDEPENDENT_AMBULATORY_CARE_PROVIDER_SITE_OTHER): Payer: Commercial Managed Care - PPO | Admitting: Physician Assistant

## 2020-03-13 ENCOUNTER — Encounter: Payer: Self-pay | Admitting: Physician Assistant

## 2020-03-13 DIAGNOSIS — Z7289 Other problems related to lifestyle: Secondary | ICD-10-CM | POA: Insufficient documentation

## 2020-03-13 DIAGNOSIS — Z789 Other specified health status: Secondary | ICD-10-CM | POA: Insufficient documentation

## 2020-03-13 DIAGNOSIS — F1090 Alcohol use, unspecified, uncomplicated: Secondary | ICD-10-CM | POA: Insufficient documentation

## 2020-03-13 DIAGNOSIS — Z5329 Procedure and treatment not carried out because of patient's decision for other reasons: Secondary | ICD-10-CM

## 2020-03-13 NOTE — Progress Notes (Signed)
No show

## 2020-03-13 NOTE — Progress Notes (Signed)
Iva,   Cholesterol looks great.  Kidney, liver, glucose look great.  PSA perfect.  RBC are a little big but likely due to alcohol.

## 2020-03-14 ENCOUNTER — Ambulatory Visit (INDEPENDENT_AMBULATORY_CARE_PROVIDER_SITE_OTHER): Payer: Commercial Managed Care - PPO | Admitting: Physician Assistant

## 2020-03-14 VITALS — BP 159/90 | HR 78 | Temp 98.2°F | Ht 71.0 in | Wt 204.0 lb

## 2020-03-14 DIAGNOSIS — Z8719 Personal history of other diseases of the digestive system: Secondary | ICD-10-CM

## 2020-03-14 DIAGNOSIS — T148XXA Other injury of unspecified body region, initial encounter: Secondary | ICD-10-CM | POA: Diagnosis not present

## 2020-03-14 DIAGNOSIS — Z9889 Other specified postprocedural states: Secondary | ICD-10-CM | POA: Diagnosis not present

## 2020-03-14 NOTE — Patient Instructions (Signed)
Hematoma A hematoma is a collection of blood. A hematoma can happen:  Under the skin.  In an organ.  In a body space.  In a joint space.  In other tissues. The blood can thicken (clot) to form a lump that you can see and feel. The lump is often hard and may become sore and tender. The lump can be very small or very big. Most hematomas get better in a few days to weeks. However, some hematomas may be serious and need medical care. What are the causes? This condition is caused by:  An injury.  Blood that leaks under the skin.  Problems from surgeries.  Medical conditions that cause bleeding or bruising. What increases the risk? You are more likely to develop this condition if:  You are an older adult.  You use medicines that thin your blood. What are the signs or symptoms? Symptoms depend on where the hematoma is in your body.  If the hematoma is under the skin, there is: ? A firm lump on the body. ? Pain and tenderness in the area. ? Bruising. The skin above the lump may be blue, dark blue, purple-red, or yellowish.  If the hematoma is deep in the tissues or body spaces, there may be: ? Blood in the stomach. This may cause pain in the belly (abdomen), weakness, passing out (fainting), and shortness of breath. ? Blood in the head. This may cause a headache, weakness, trouble speaking or understanding speech, or passing out. How is this diagnosed? This condition is diagnosed based on:  Your medical history.  A physical exam.  Imaging tests, such as ultrasound or CT scan.  Blood tests. How is this treated? Treatment depends on the cause, size, and location of the hematoma. Treatment may include:  Doing nothing. Many hematomas go away on their own without treatment.  Surgery or close monitoring. This may be needed for large hematomas or hematomas that affect the body's organs.  Medicines. These may be given if a medical condition caused the hematoma. Follow these  instructions at home: Managing pain, stiffness, and swelling   If told, put ice on the area. ? Put ice in a plastic bag. ? Place a towel between your skin and the bag. ? Leave the ice on for 20 minutes, 2-3 times a day for the first two days.  If told, put heat on the affected area after putting ice on the area for two days. Use the heat source that your doctor tells you to use. This could be a moist heat pack or a heating pad. To do this: ? Place a towel between your skin and the heat source. ? Leave the heat on for 20-30 minutes. ? Remove the heat if your skin turns bright red. This is very important if you are unable to feel pain, heat, or cold. You may have a greater risk of getting burned.  Raise (elevate) the affected area above the level of your heart while you are sitting or lying down.  Wrap the affected area with an elastic bandage, if told by your doctor. Do not wrap the bandage too tightly.  If your hematoma is on a leg or foot and is painful, your doctor may give you crutches. Use them as told by your doctor. General instructions  Take over-the-counter and prescription medicines only as told by your doctor.  Keep all follow-up visits as told by your doctor. This is important. Contact a doctor if:  You have a fever.    The swelling or bruising gets worse.  You start to get more hematomas. Get help right away if:  Your pain gets worse.  Your pain is not getting better with medicine.  Your skin over the hematoma breaks or starts to bleed.  Your hematoma is in your chest or belly and you: ? Pass out. ? Feel weak. ? Become short of breath.  You have a hematoma on your scalp that is caused by a fall or injury, and you: ? Have a headache that gets worse. ? Have trouble speaking or understanding speech. ? Become less alert or you pass out. Summary  A hematoma is a collection of blood in any part of your body.  Most hematomas get better on their own in a few days  to weeks. Some may need medical care.  Follow instructions from your doctor about how to care for your hematoma.  Contact a doctor if the swelling or bruising gets worse, or if you are short of breath. This information is not intended to replace advice given to you by your health care provider. Make sure you discuss any questions you have with your health care provider. Document Revised: 01/22/2018 Document Reviewed: 01/22/2018 Elsevier Patient Education  2020 [employer]  

## 2020-03-15 ENCOUNTER — Encounter: Payer: Self-pay | Admitting: Physician Assistant

## 2020-03-15 NOTE — Progress Notes (Signed)
   Subjective:    Patient ID: SHERMAINE BRIGHAM, male    DOB: 11-12-1965, 54 y.o.   MRN: [id number]  HPI  Pt is a 54 yo male who presents to the clinic with some right antecubital bruising and soreness after having blood drawn on 03/10/2020. He has done nothing to make better but looks a little better today. A little sore but no real pain.   .. Active Ambulatory Problems    Diagnosis Date Noted  . Tobacco abuse 03/02/2014  . Gout 03/07/2014  . Essential hypertension, benign 03/07/2014  . Anxiety 08/31/2014  . Depression 08/31/2014  . Alcohol abuse 10/30/2016  . Arthritis of knee, left 02/25/2017  . Hemoptysis 03/24/2017  . Pleural effusion 03/24/2017  . Dry heaves 10/05/2017  . Upper abdominal pain 10/05/2017  . Gastroesophageal reflux disease with esophagitis 10/05/2017  . Hoarseness 10/05/2017  . Wheezing 10/05/2017  . Current smoker 10/05/2017  . 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 Left testicular pain 12/02/2019  . Status post hernia repair 03/10/2020  . Alcohol use 03/13/2020   Resolved Ambulatory Problems    Diagnosis Date Noted  . Acute gout 03/07/2014  . Pain in the chest 03/07/2014  . Right foot pain 06/14/2014   No Additional Past Medical History       Review of Systems See HPI.     Objective:   Physical Exam Skin:    Comments: Right antecubital bruising and area of induration around needle entry. Slightly tender to palpation. No redness or warmth.   Left inginal well healed incision.   Neurological:     Mental Status: He is alert.           Assessment & Plan:  Marland KitchenMarland KitchenAbrian was seen today for skin problem.  Diagnoses and all orders for this visit:  Hematoma  Status post hernia repair   Caused by needle stick. Reassurance given. HO give on care. Follow up as needed or if symptoms worsen.   Reassurance incision is healing well. Ok for vitamin E cream and gentle massage. Still having some concerns with lifting and pulling. Ok to go back to work at  end of July.

## 2020-03-16 ENCOUNTER — Other Ambulatory Visit: Payer: Self-pay | Admitting: *Deleted

## 2020-03-16 DIAGNOSIS — F17210 Nicotine dependence, cigarettes, uncomplicated: Secondary | ICD-10-CM

## 2020-03-16 NOTE — Progress Notes (Signed)
c 

## 2020-03-22 ENCOUNTER — Other Ambulatory Visit: Payer: Commercial Managed Care - PPO

## 2020-03-22 ENCOUNTER — Ambulatory Visit: Payer: Commercial Managed Care - PPO | Admitting: Physician Assistant

## 2020-03-29 ENCOUNTER — Ambulatory Visit (INDEPENDENT_AMBULATORY_CARE_PROVIDER_SITE_OTHER): Payer: Commercial Managed Care - PPO | Admitting: Acute Care

## 2020-03-29 ENCOUNTER — Other Ambulatory Visit: Payer: Self-pay

## 2020-03-29 ENCOUNTER — Encounter: Payer: Self-pay | Admitting: Acute Care

## 2020-03-29 DIAGNOSIS — Z122 Encounter for screening for malignant neoplasm of respiratory organs: Secondary | ICD-10-CM

## 2020-03-29 DIAGNOSIS — F17210 Nicotine dependence, cigarettes, uncomplicated: Secondary | ICD-10-CM | POA: Diagnosis not present

## 2020-03-29 NOTE — Patient Instructions (Addendum)
Thank you for participating in the [facility]. It was our pleasure to meet you today. We will call you with the results of your scan within the next few days. Your scan will be assigned a Lung RADS category score by the physicians reading the scans.  This Lung RADS score determines follow up scanning.  See below for description of categories, and follow up screening recommendations. We will be in touch to schedule your follow up screening annually or based on recommendations of our providers. We will fax a copy of your scan results to your Primary Care Physician, or the physician who referred you to the program, to ensure they have the results. Please call the office if you have any questions or concerns regarding your scanning experience or results.  Our office number is [phone]. Please speak with Abigail Miyamoto, RN. She is our Lung Cancer Data processing manager. If she is unavailable when you call, please have the office staff send her a message. She will return your call at her earliest convenience. Remember, if your scan is normal, we will scan you annually as long as you continue to meet the criteria for the program. (Age 61-77, Current smoker or smoker who has quit within the last 15 years). If you are a smoker, remember, quitting is the single most powerful action that you can take to decrease your risk of lung cancer and other pulmonary, breathing related problems. We know quitting is hard, and we are here to help.  Please let us know if there is anything we can do to help you meet your goal of quitting. If you are a former smoker, Counselling psychologist. We are proud of you! Remain smoke free! Remember you can refer friends or family members through the number above.  We will screen them to make sure they meet criteria for the program. Thank you for helping [place] take better care of you by participating in Lung Screening.  Lung RADS Categories:  Lung RADS 1: no nodules  or definitely non-concerning nodules.  Recommendation is for a repeat annual scan in 12 months.  Lung RADS 2:  nodules that are non-concerning in appearance and behavior with a very low likelihood of becoming an active cancer. Recommendation is for a repeat annual scan in 12 months.  Lung RADS 3: nodules that are probably non-concerning , includes nodules with a low likelihood of becoming an active cancer.  Recommendation is for a 37-month repeat screening scan. Often noted after an upper respiratory illness. We will be in touch to make sure you have no questions, and to schedule your 49-month scan.  Lung RADS 4 A: nodules with concerning findings, recommendation is most often for a follow up scan in 3 months or additional testing based on our provider's assessment of the scan. We will be in touch to make sure you have no questions and to schedule the recommended 3 month follow up scan.  Lung RADS 4 B:  indicates findings that are concerning. We will be in touch with you to schedule additional diagnostic testing based on our provider's  assessment of the scan.  Please schedule patient for a smoking cessation visit with the pharmacy. He travels, so he will need to schedule on a day he is in [place].

## 2020-03-29 NOTE — Progress Notes (Signed)
Shared Decision Making Visit Lung Cancer Screening Program [phone])   Eligibility:  Age 54 y.o.  Pack Years Smoking History Calculation 68 pack year smoking history (# packs/per year x # years smoked)  Recent History of coughing up blood  no  Unexplained weight loss? no ( >Than 15 pounds within the last 6 months )  Prior History Lung / other cancer no (Diagnosis within the last 5 years already requiring surveillance chest CT Scans).  Smoking Status Current Smoker  Former Smokers: Years since quit: NA  Quit Date: NA  Visit Components:  Discussion included one or more decision making aids. yes  Discussion included risk/benefits of screening. yes  Discussion included potential follow up diagnostic testing for abnormal scans. yes  Discussion included meaning and risk of over diagnosis. yes  Discussion included meaning and risk of False Positives. yes  Discussion included meaning of total radiation exposure. yes  Counseling Included:  Importance of adherence to annual lung cancer LDCT screening. yes  Impact of comorbidities on ability to participate in the program. yes  Ability and willingness to under diagnostic treatment. yes  Smoking Cessation Counseling:  Current Smokers:   Discussed importance of smoking cessation. yes  Information about tobacco cessation classes and interventions provided to patient. yes  Patient provided with "ticket" for LDCT Scan. yes  Symptomatic Patient. no  Counseling  Diagnosis Code: Tobacco Use Z72.0  Asymptomatic Patient yes  Counseling (Intermediate counseling: > three minutes counseling) S2831  Former Smokers:   Discussed the importance of maintaining cigarette abstinence. yes  Diagnosis Code: Personal History of Nicotine Dependence. D17.616  Information about tobacco cessation classes and interventions provided to patient. Yes  Patient provided with "ticket" for LDCT Scan. yes  Written Order for Lung Cancer  Screening with LDCT placed in Epic. Yes (CT Chest Lung Cancer Screening Low Dose W/O CM) [id number] Z12.2-Screening of respiratory organs Z87.891-Personal history of nicotine dependence  I have spent 25 minutes of face to face time with Mr. Falck discussing the risks and benefits of lung cancer screening. We viewed a power point together that explained in detail the above noted topics. We paused at intervals to allow for questions to be asked and answered to ensure understanding.We discussed that the single most powerful action that he can take to decrease his risk of developing lung cancer is to quit smoking. We discussed whether or not he is ready to commit to setting a quit date. We discussed options for tools to aid in quitting smoking including nicotine replacement therapy, non-nicotine medications, support groups, Quit Smart classes, and behavior modification. We discussed that often times setting smaller, more achievable goals, such as eliminating 1 cigarette a day for a week and then 2 cigarettes a day for a week can be helpful in slowly decreasing the number of cigarettes smoked. This allows for a sense of accomplishment as well as providing a clinical benefit. I gave him the " Be Stronger Than Your Excuses" card with contact information for community resources, classes, free nicotine replacement therapy, and access to mobile apps, text messaging, and on-line smoking cessation help. I have also given him my card and contact information in the event he needs to contact me. We discussed the time and location of the scan, and that either Abigail Miyamoto RN or I will call with the results within 24-48 hours of receiving them. I have offered him  a copy of the power point we viewed  as a resource in the event they need reinforcement of  the concepts we discussed today in the office. The patient verbalized understanding of all of  the above and had no further questions upon leaving the office. They have my  contact information in the event they have any further questions.  I spent 3 minutes counseling on smoking cessation and the health risks of continued tobacco abuse.  I explained to the patient that there has been a high incidence of coronary artery disease noted on these exams. I explained that this is a non-gated exam therefore degree or severity cannot be determined. This patient is on statin therapy. I have asked the patient to follow-up with their PCP regarding any incidental finding of coronary artery disease and management with diet or medication as their PCP  feels is clinically indicated. The patient verbalized understanding of the above and had no further questions upon completion of the visit.   Pt. Would like an OV to speak with Pharmacy regarding options for smoking cessation.   Bevelyn Ngo, NP 03/29/2020

## 2020-04-03 ENCOUNTER — Ambulatory Visit (INDEPENDENT_AMBULATORY_CARE_PROVIDER_SITE_OTHER): Payer: Commercial Managed Care - PPO

## 2020-04-03 ENCOUNTER — Ambulatory Visit: Payer: Commercial Managed Care - PPO | Admitting: Physician Assistant

## 2020-04-03 ENCOUNTER — Ambulatory Visit (INDEPENDENT_AMBULATORY_CARE_PROVIDER_SITE_OTHER): Payer: Commercial Managed Care - PPO | Admitting: Physician Assistant

## 2020-04-03 ENCOUNTER — Other Ambulatory Visit: Payer: Commercial Managed Care - PPO

## 2020-04-03 ENCOUNTER — Other Ambulatory Visit: Payer: Self-pay

## 2020-04-03 DIAGNOSIS — F17210 Nicotine dependence, cigarettes, uncomplicated: Secondary | ICD-10-CM | POA: Diagnosis not present

## 2020-04-03 DIAGNOSIS — Z5329 Procedure and treatment not carried out because of patient's decision for other reasons: Secondary | ICD-10-CM

## 2020-04-03 NOTE — Progress Notes (Signed)
No show

## 2020-04-04 ENCOUNTER — Other Ambulatory Visit: Payer: Commercial Managed Care - PPO

## 2020-04-10 NOTE — Progress Notes (Signed)
Please call patient and let them  know their  low dose Ct was read as a Lung RADS 1, negative study: no nodules or definitely benign nodules. Radiology recommendation is for a repeat LDCT in 12 months. .Please let them  know we will order and schedule their  annual screening scan for 04/2021. Please let them  know there was notation of CAD on their  scan.  Please remind the patient  that this is a non-gated exam therefore degree or severity of disease  cannot be determined. Please have them  follow up with their PCP regarding potential risk factor modification, dietary therapy or pharmacologic therapy if clinically indicated. Pt.  is  not currently on statin therapy. Please place order for annual  screening scan for  04/2021 and fax results to PCP. Thanks so much. 

## 2020-04-11 ENCOUNTER — Other Ambulatory Visit: Payer: Self-pay | Admitting: *Deleted

## 2020-04-11 DIAGNOSIS — F17210 Nicotine dependence, cigarettes, uncomplicated: Secondary | ICD-10-CM

## 2020-04-24 ENCOUNTER — Other Ambulatory Visit: Payer: Commercial Managed Care - PPO

## 2020-04-24 NOTE — Progress Notes (Unsigned)
Subjective Patient presents to [facility] and seen by the pharmacist for smoking cessation counseling.      1. Smoking Cessation visit 2. Medication Reconciliation 3. Below  Medications (No history of seizures, CrCl 157 ml/min) . ??NRT? . Previously tried Wellbutrin in 2018, primarily for anxiety and depression . Tried Chantix - says did not cut down on any cravings or urges  Ask questions further down . Below to assess readiness to quit. If not ready to quit, set goals (Ex: stop smoking in car and/or house) . Smoking in the house or car?  Review STAR quit plan and documents   Assess Immunizations  - Prevnar 13 - Pneumovax 23  - Shingrix (2 doses) - Influenzae   F/u via phone or clinic>>>   Social History   Tobacco Use  Smoking Status Current Every Day Smoker  . Packs/day: 1.75  . Years: 39.00  . Pack years: 68.25  Smokeless Tobacco Never Used     Tobacco Use History  Age when started using tobacco on a daily basis ***.  Type: {Nicotine:3044014::"cigarettes","cigar","pipe","electronic cigarettes","snus"}.  Number of cigarettes per day ***, brand ***.  Smokes first cigarette *** minutes after waking.  {Does/does not:3044014::"Does","Does not"} wake at night to smoke  Triggers include {hx nicotine triggers:311123}.  Quit Attempt History   Most recent quit attempt ***.  Longest time ever been tobacco free ***.  Methods tried in the past include {CHL AMB [facility] MEDICATIONS FOR SMOKING CESSATION:20759}.   Rates IMPORTANCE of quitting tobacco on 1-10 scale of ***.  Rates READINESS of quitting tobacco on 1-10 scale of ***.  Rates CONFIDENCE of quitting tobacco on 1-10 scale of ***.  Motivators to quitting include ***; barriers include {smoking cessation barriers:18118}    Immunization History  Administered Date(s) Administered  . Influenza,inj,Quad PF,6+ Mos 09/01/2015  . Tdap 03/02/2014     Assessment and Plan  1. Smoking Cessation    Patient states they are ready to quit smoking.  Patient set quit date of ***.   Discussed smoking cessation agent Chantix, Wellbutrin, and nicotine replacement therapies.  Patient is agreeable to ***. Nicotine Patch  Patient counseled on purpose, proper use, and potential adverse effects, including mild itching or redness at the point of application, headache, trouble sleeping, and/or vivid dreams     Patch Schedule for >10 cigarettes daily Weeks 1-6: one 21 mg patch daily Weeks 7-8: one 14 mg patch daily Weeks 9-10: one 7 mg patch daily  Patch Schedule for <10 cigarettes daily Weeks 1 to 6: one nicotine patch (14 mg) daily. I will call and re-assess how you are doing at the end of 6 weeks to see how you are doing on 14 mg patch and if you are ready to decrease dose of patch. Weeks 7-8: one nicotine patch (7 mg) daily  Nicotine Lozenge Patient counseled on purpose, proper use, and potential adverse effects including nausea, hiccups, cough, and heartburn.  Instructed patient to use  2 mg unless the smoke within 30 minutes of waking up in which they should use 4 mg.  Lozenge dosing schedule Weeks 1 to 6: 1 lozenge every 1 to 2 hours (maximum: 5 lozenges every 6 hours; 20 lozenges/day); to increase chances of quitting, use at least 9 lozenges/day during the first 6 weeks Weeks 7 to 9: 1 lozenge every 2 to 4 hours (maximum: 5 lozenges every 6 hours; 20 lozenges/day) Weeks 10 to 12: 1 lozenge every 4 to 8 hours (maximum: 5 lozenges every 6 hours; 20  lozenges/day) Nicotine Gum Patient counseled on purpose, proper use, and potential adverse effects including jaw soreness and upset stomach if swallowing saliva.  Instructed patient to use 4 mg if they smoke a pack a day or more and 2 mg if they smoke less than a pack a day.  Gum dosing schedule Weeks 1 to 6: Chew 1 piece of gum every 1 to 2 hours (maximum: 24 pieces/day); to increase chances of quitting, chew at least 9 pieces/day during the  first 6 weeks Weeks 7 to 9: Chew 1 piece of gum every 2 to 4 hours (maximum: 24 pieces/day) Weeks 10 to 12: Chew 1 piece of gum every 4 to 8 hours (maximum: 24 pieces/day)  Bupropion Initiated bupropion ***. Patient with no PMH of seizures. Patient counseled on purpose, proper use, and potential adverse effects, including insomnia, and potential change in mood.   Varenicline Initiated varenicline titration of 0.5 mg by mouth once daily with food x3 days, then 0.5 mg by mouth twice daily with food x4 days, then 1 mg by mouth twice daily with food thereafter.  CrCL greater than 30 mL/min. Patient counseled on purpose, proper use, and potential adverse effects, including GI upset, and potential change in mood.   Provided information on 1 800-QUIT NOW support program.   Patient is not ready to quit smoking.  Reviewed STAR quit plan and smoking cessation agents Chantix, Wellbutrin, and nicotine replacement therapy.  Schedule follow up appointment ***.  Set goal to decrease by *** cigarettes every week till next appointment.  2. Medication Reconciliation A drug regimen assessment was performed, including review of allergies, interactions, disease-state management, dosing and immunization history. Medications were reviewed with the patient, including name, instructions, indication, goals of therapy, potential side effects, importance of adherence, and safe use.  3. Immunizations Patient is indicated for influenzae, pneumonia, and shingles vaccinations.   This appointment required *** minutes of patient care (this includes precharting, chart review, review of results, face-to-face care, etc.).  Fabio Neighbors, PharmD PGY2 Ambulatory Care Resident [facility]

## 2020-04-27 ENCOUNTER — Encounter: Payer: Self-pay | Admitting: Physician Assistant

## 2020-05-01 ENCOUNTER — Other Ambulatory Visit: Payer: Commercial Managed Care - PPO

## 2020-05-25 ENCOUNTER — Encounter: Payer: Self-pay | Admitting: Physician Assistant

## 2020-05-25 ENCOUNTER — Telehealth: Payer: Self-pay | Admitting: Neurology

## 2020-05-25 DIAGNOSIS — I700 Atherosclerosis of aorta: Secondary | ICD-10-CM | POA: Insufficient documentation

## 2020-05-25 DIAGNOSIS — J439 Emphysema, unspecified: Secondary | ICD-10-CM | POA: Insufficient documentation

## 2020-05-25 NOTE — Progress Notes (Signed)
On your annual CT lung cancer screen some plaque accumulation was noticed. It is recommended that you start cholesterol medication to help reduce this and your risk of CV disease. Are you willing to start?

## 2020-05-25 NOTE — Telephone Encounter (Signed)
Tried to call patient about below message, no answer. Line disconnects.  Editor: Nolene Ebbs (Physician Assistant)     On your annual CT lung cancer screen some plaque accumulation was noticed. It is recommended that you start cholesterol medication to help reduce this and your risk of CV disease. Are you willing to start?

## 2020-05-25 NOTE — Telephone Encounter (Signed)
-----   Message from Jomarie Longs, [place] sent at 05/25/2020  6:54 AM EDT -----   ----- Message ----- From: Jenna Luo, RN Sent: 04/11/2020   9:57 AM EDT To: Jomarie Longs, PA-C

## 2020-05-26 NOTE — Telephone Encounter (Signed)
Tried to call again, rings twice and line disconnects.

## 2020-05-29 NOTE — Telephone Encounter (Signed)
Attempted to contact and can not reach patient.

## 2020-05-30 ENCOUNTER — Other Ambulatory Visit: Payer: Self-pay | Admitting: Physician Assistant

## 2020-05-30 DIAGNOSIS — I10 Essential (primary) hypertension: Secondary | ICD-10-CM

## 2020-07-29 ENCOUNTER — Other Ambulatory Visit: Payer: Self-pay | Admitting: Physician Assistant

## 2020-07-29 DIAGNOSIS — I10 Essential (primary) hypertension: Secondary | ICD-10-CM

## 2020-07-29 DIAGNOSIS — M1A9XX0 Chronic gout, unspecified, without tophus (tophi): Secondary | ICD-10-CM

## 2020-07-31 MED ORDER — LOSARTAN POTASSIUM 50 MG PO TABS: 50.0000 mg | ORAL_TABLET | Freq: Every day | ORAL | 0 refills | Status: DC

## 2020-07-31 NOTE — Addendum Note (Signed)
Addended bySilvio Pate on: 07/31/2020 11:52 AM   Modules accepted: Orders

## 2020-08-16 ENCOUNTER — Ambulatory Visit (INDEPENDENT_AMBULATORY_CARE_PROVIDER_SITE_OTHER): Payer: Commercial Managed Care - PPO | Admitting: Physician Assistant

## 2020-08-16 DIAGNOSIS — Z5329 Procedure and treatment not carried out because of patient's decision for other reasons: Secondary | ICD-10-CM

## 2020-08-16 NOTE — Progress Notes (Signed)
No show

## 2020-08-18 ENCOUNTER — Ambulatory Visit: Payer: Commercial Managed Care - PPO | Admitting: Physician Assistant

## 2020-08-21 ENCOUNTER — Ambulatory Visit (INDEPENDENT_AMBULATORY_CARE_PROVIDER_SITE_OTHER): Payer: Commercial Managed Care - PPO | Admitting: Physician Assistant

## 2020-08-21 ENCOUNTER — Telehealth: Payer: Self-pay | Admitting: Physician Assistant

## 2020-08-21 DIAGNOSIS — Z5329 Procedure and treatment not carried out because of patient's decision for other reasons: Secondary | ICD-10-CM

## 2020-08-21 NOTE — Progress Notes (Signed)
No show

## 2020-08-21 NOTE — Telephone Encounter (Signed)
Pt called at 1:08 to cancel 1:40 appt.  Thank you.

## 2020-08-21 NOTE — Telephone Encounter (Signed)
Ok to cancel so not charged

## 2020-08-22 ENCOUNTER — Other Ambulatory Visit: Payer: Self-pay

## 2020-08-22 ENCOUNTER — Encounter: Payer: Self-pay | Admitting: Physician Assistant

## 2020-08-22 ENCOUNTER — Ambulatory Visit (INDEPENDENT_AMBULATORY_CARE_PROVIDER_SITE_OTHER): Payer: Commercial Managed Care - PPO | Admitting: Physician Assistant

## 2020-08-22 VITALS — BP 142/91 | HR 79 | Ht 71.0 in | Wt 199.0 lb

## 2020-08-22 DIAGNOSIS — M1A9XX0 Chronic gout, unspecified, without tophus (tophi): Secondary | ICD-10-CM | POA: Diagnosis not present

## 2020-08-22 DIAGNOSIS — I700 Atherosclerosis of aorta: Secondary | ICD-10-CM | POA: Diagnosis not present

## 2020-08-22 DIAGNOSIS — I10 Essential (primary) hypertension: Secondary | ICD-10-CM | POA: Diagnosis not present

## 2020-08-22 DIAGNOSIS — Z720 Tobacco use: Secondary | ICD-10-CM

## 2020-08-22 DIAGNOSIS — Z1211 Encounter for screening for malignant neoplasm of colon: Secondary | ICD-10-CM

## 2020-08-22 DIAGNOSIS — J432 Centrilobular emphysema: Secondary | ICD-10-CM

## 2020-08-22 MED ORDER — METOPROLOL TARTRATE 50 MG PO TABS: ORAL_TABLET | ORAL | 1 refills | Status: DC

## 2020-08-22 MED ORDER — ALLOPURINOL 100 MG PO TABS: ORAL_TABLET | ORAL | 3 refills | Status: DC

## 2020-08-22 MED ORDER — ATORVASTATIN CALCIUM 10 MG PO TABS: 10.0000 mg | ORAL_TABLET | Freq: Every day | ORAL | 3 refills | Status: DC

## 2020-08-22 MED ORDER — BREO ELLIPTA 100-25 MCG/INH IN AEPB: 1.0000 | INHALATION_SPRAY | Freq: Every day | RESPIRATORY_TRACT | 3 refills | Status: DC

## 2020-08-22 MED ORDER — LOSARTAN POTASSIUM 50 MG PO TABS: 50.0000 mg | ORAL_TABLET | Freq: Every day | ORAL | 1 refills | Status: DC

## 2020-08-22 NOTE — Patient Instructions (Signed)
BREO one puff once a day.  Albuterol as needed Stay on same blood pressure medications.  Start lipitor 10mg  daily.

## 2020-08-22 NOTE — Progress Notes (Signed)
Subjective:    Patient ID: Michael Meza, male    DOB: [birth year], 54 y.o.   MRN: [id number]  HPI  Pt is a 54 yo male with HTN, aortic atherosclerosis, emphysema who presents to the clinic for medication refills.   HTN- denies any CP, palpitations, headaches or vision changes. Taking medication daily.   COPD- breathing ok. Using albuterol once or twice a day. Continues to smoke. Had lung cancer screening that was normal.   Gout- controlled on allopurinol no attacks.   .. Active Ambulatory Problems    Diagnosis Date Noted  . Tobacco abuse 03/02/2014  . Gout 03/07/2014  . Essential hypertension, benign 03/07/2014  . Anxiety 08/31/2014  . Depression 08/31/2014  . Alcohol abuse 10/30/2016  . Arthritis of knee, left 02/25/2017  . Hemoptysis 03/24/2017  . Pleural effusion 03/24/2017  . Dry heaves 10/05/2017  . Upper abdominal pain 10/05/2017  . Gastroesophageal reflux disease with esophagitis 10/05/2017  . Hoarseness 10/05/2017  . Wheezing 10/05/2017  . Current smoker 10/05/2017  . 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 Left testicular pain 12/02/2019  . Status post hernia repair 03/10/2020  . Alcohol use 03/13/2020  . Encounter for screening for lung cancer 03/29/2020  . Aortic atherosclerosis (HCC) 05/25/2020  . Emphysema of lung (HCC) 05/25/2020   Resolved Ambulatory Problems    Diagnosis Date Noted  . Acute gout 03/07/2014  . Pain in the chest 03/07/2014  . Right foot pain 06/14/2014   No Additional Past Medical History      Review of Systems  All other systems reviewed and are negative.      Objective:   Physical Exam Vitals reviewed.  Constitutional:      Appearance: Normal appearance.  Neck:     Vascular: No carotid bruit.  Cardiovascular:     Rate and Rhythm: Normal rate and regular rhythm.     Pulses: Normal pulses.     Heart sounds: Normal heart sounds. No murmur heard.   Pulmonary:     Effort: Pulmonary effort is normal.     Breath sounds:  Wheezing present.     Comments: Coarse breath sounds.  Musculoskeletal:     Right lower leg: No edema.     Left lower leg: No edema.  Neurological:     Mental Status: He is alert.  Psychiatric:        Mood and Affect: Mood normal.           Assessment & Plan:  Marland KitchenMarland KitchenXzavion was seen today for hypertension.  Diagnoses and all orders for this visit:  Essential hypertension, benign -     losartan (COZAAR) 50 MG tablet; Take 1 tablet (50 mg total) by mouth daily. -     COMPLETE METABOLIC PANEL WITH GFR -     metoprolol tartrate (LOPRESSOR) 50 MG tablet; TAKE 1 TABLET BY MOUTH TWICE DAILY.  Aortic atherosclerosis (HCC) -     atorvastatin (LIPITOR) 10 MG tablet; Take 1 tablet (10 mg total) by mouth daily.  Chronic gout without tophus, unspecified cause, unspecified site -     allopurinol (ZYLOPRIM) 100 MG tablet; TAKE 1 TABLET(100 MG) BY MOUTH TWICE DAILY -     Uric acid  Colon cancer screening -     Ambulatory referral to Gastroenterology  Centrilobular emphysema (HCC) -     fluticasone furoate-vilanterol (BREO ELLIPTA) 100-25 MCG/INH AEPB; Inhale 1 puff into the lungs daily.  Tobacco abuse   Pt is out of BP medication.  Refilled today.  cmp ordered.   Gout-stable refilled alloupurinol. Check uric acid.   Aortic atherosclerosis seen on CT. Pt agreed to start lipitor. Last LdL was 70.   Emphysema- UTD lung cancer screen. Pt using albuterol daily. Start BREO. Trelegy was not preferred.  Follow up in 3 months. Reminder to rinse mouth out after every puff.   Discussed smoking cessation. Pt states medications do not help him. "he will just vape and do it on his on". He knows that he needs to stop smoking.   Colonoscopy ordered.

## 2020-08-22 NOTE — Telephone Encounter (Signed)
Done. Thank you.

## 2020-08-23 ENCOUNTER — Other Ambulatory Visit: Payer: Self-pay | Admitting: Neurology

## 2020-08-23 DIAGNOSIS — R748 Abnormal levels of other serum enzymes: Secondary | ICD-10-CM

## 2020-08-23 LAB — COMPLETE METABOLIC PANEL WITH GFR
AG Ratio: 1.8 (calc) (ref 1.0–2.5)
ALT: 28 U/L (ref 9–46)
AST: 55 U/L — ABNORMAL HIGH (ref 10–35)
Albumin: 4.2 g/dL (ref 3.6–5.1)
Alkaline phosphatase (APISO): 88 U/L (ref 35–144)
BUN/Creatinine Ratio: 8 (calc) (ref 6–22)
BUN: 5 mg/dL — ABNORMAL LOW (ref 7–25)
CO2: 26 mmol/L (ref 20–32)
Calcium: 8.8 mg/dL (ref 8.6–10.3)
Chloride: 100 mmol/L (ref 98–110)
Creat: 0.63 mg/dL — ABNORMAL LOW (ref 0.70–1.33)
GFR, Est African American: 129 mL/min/{1.73_m2} (ref >=60)
GFR, Est Non African American: 111 mL/min/{1.73_m2} (ref >=60)
Globulin: 2.3 g/dL (calc) (ref 1.9–3.7)
Glucose, Bld: 84 mg/dL (ref 65–99)
Potassium: 4.1 mmol/L (ref 3.5–5.3)
Sodium: 136 mmol/L (ref 135–146)
Total Bilirubin: 0.8 mg/dL (ref 0.2–1.2)
Total Protein: 6.5 g/dL (ref 6.1–8.1)

## 2020-08-23 LAB — URIC ACID: Uric Acid, Serum: 6.6 mg/dL (ref 4.0–8.0)

## 2020-08-23 NOTE — Progress Notes (Signed)
Yusif,   Your liver enzymes are up some. Have you been taking tylenol regularly? How much alcohol are you drinking? Stop alcohol and tylenol and recheck labs in 2 weeks.   Your uric acid is not quite to target but you have not had any gout attacks. We could increase allopurinol a bit or just keep the same.

## 2020-10-03 ENCOUNTER — Ambulatory Visit: Payer: Commercial Managed Care - PPO | Admitting: Physician Assistant

## 2020-10-04 ENCOUNTER — Encounter: Payer: Self-pay | Admitting: Physician Assistant

## 2020-10-04 ENCOUNTER — Ambulatory Visit (INDEPENDENT_AMBULATORY_CARE_PROVIDER_SITE_OTHER): Payer: Commercial Managed Care - PPO | Admitting: Physician Assistant

## 2020-10-04 ENCOUNTER — Other Ambulatory Visit: Payer: Self-pay

## 2020-10-04 VITALS — BP 158/89 | HR 80 | Temp 97.8°F | Ht 71.0 in | Wt 195.0 lb

## 2020-10-04 DIAGNOSIS — R52 Pain, unspecified: Secondary | ICD-10-CM

## 2020-10-04 DIAGNOSIS — B349 Viral infection, unspecified: Secondary | ICD-10-CM

## 2020-10-04 DIAGNOSIS — R5381 Other malaise: Secondary | ICD-10-CM

## 2020-10-04 DIAGNOSIS — L089 Local infection of the skin and subcutaneous tissue, unspecified: Secondary | ICD-10-CM

## 2020-10-04 DIAGNOSIS — R059 Cough, unspecified: Secondary | ICD-10-CM | POA: Diagnosis not present

## 2020-10-04 MED ORDER — SULFAMETHOXAZOLE-TRIMETHOPRIM 800-160 MG PO TABS: 1.0000 | ORAL_TABLET | Freq: Two times a day (BID) | ORAL | 0 refills | Status: DC

## 2020-10-04 NOTE — Patient Instructions (Signed)

## 2020-10-04 NOTE — Progress Notes (Signed)
Subjective:    Patient ID: Michael Meza, male    DOB: 08/20/1966, 55 y.o.   MRN: [id number]  HPI  Patient is a 55 year old male who presents to the clinic with a painful draining skin lesion of his suprapubic area and URI symptoms.  Patient noticed after shaving about a week ago he developed a bump that was tender.  Now there is pus coming out of it.  He feels swollen and tender in the suprapubic area.  He has a few more bumps coming up.  He has not tried anything to make better.  He denies any fever, chills he does have some body aches.  He also just started feeling very fatigued and under the weather around Sunday.  He does have a slightly productive cough.  He denies any significant shortness of breath.  He is not take anything to make better.  He has not been able to go to work this week.  He does need a work note.  He has not been COVID vaccinated.  He denies any direct Covid exposure.    .. Active Ambulatory Problems    Diagnosis Date Noted  . Tobacco abuse 03/02/2014  . Gout 03/07/2014  . Essential hypertension, benign 03/07/2014  . Anxiety 08/31/2014  . Depression 08/31/2014  . Alcohol abuse 10/30/2016  . Arthritis of knee, left 02/25/2017  . Hemoptysis 03/24/2017  . Pleural effusion 03/24/2017  . Dry heaves 10/05/2017  . Upper abdominal pain 10/05/2017  . Gastroesophageal reflux disease with esophagitis 10/05/2017  . Hoarseness 10/05/2017  . Wheezing 10/05/2017  . Current smoker 10/05/2017  . 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 Left testicular pain 12/02/2019  . Status post hernia repair 03/10/2020  . Alcohol use 03/13/2020  . Encounter for screening for lung cancer 03/29/2020  . Aortic atherosclerosis (HCC) 05/25/2020  . Emphysema of lung (HCC) 05/25/2020   Resolved Ambulatory Problems    Diagnosis Date Noted  . Acute gout 03/07/2014  . Pain in the chest 03/07/2014  . Right foot pain 06/14/2014   No Additional Past Medical History     Review of Systems See  HPI.     Objective:   Physical Exam Vitals reviewed. Exam conducted with a chaperone present.  Constitutional:      Appearance: Normal appearance. He is obese.  HENT:     Head: Normocephalic.     Right Ear: Tympanic membrane normal.     Left Ear: Tympanic membrane normal.     Nose: Congestion present.     Mouth/Throat:     Mouth: Mucous membranes are moist.  Eyes:     Conjunctiva/sclera: Conjunctivae normal.     Pupils: Pupils are equal, round, and reactive to light.  Cardiovascular:     Rate and Rhythm: Normal rate and regular rhythm.     Pulses: Normal pulses.     Heart sounds: Normal heart sounds.  Pulmonary:     Effort: Pulmonary effort is normal.     Breath sounds: Normal breath sounds.  Abdominal:     General: Bowel sounds are normal. There is no distension.     Palpations: Abdomen is soft.     Tenderness: There is no abdominal tenderness. There is no right CVA tenderness, left CVA tenderness or guarding.  Skin:    Comments: 2 or 3 pustules in the suprapubic area.  1 particularly draining and some redness and tenderness spreading around it.  Neurological:     General: No focal deficit present.  Mental Status: He is alert and oriented to person, place, and time.  Psychiatric:        Mood and Affect: Mood normal.           Assessment & Plan:  Marland KitchenMarland KitchenDaveyon was seen today for groin pain.  Diagnoses and all orders for this visit:  Viral infection  Pustule -     sulfamethoxazole-trimethoprim (BACTRIM DS) 800-160 MG tablet; Take 1 tablet by mouth 2 (two) times daily.  Cough -     Novel Coronavirus, NAA ([facility])  Malaise -     Novel Coronavirus, NAA ([facility])  Body aches -     Novel Coronavirus, NAA ([facility])  Skin infection -     sulfamethoxazole-trimethoprim (BACTRIM DS) 800-160 MG tablet; Take 1 tablet by mouth 2 (two) times daily.  Other orders -     Discontinue: sulfamethoxazole-trimethoprim (BACTRIM DS) 800-160 MG tablet; Take 1 tablet by mouth 2  (two) times daily.   Appears like viral infection. covid swabbed today. Written out of work for 10 day quarantine or negative test. Discussed symptomatic care.  Patient does have some ongoing COPD.  If any worsening shortness of breath go to urgent care or call our office.  Discuss .Marland KitchenVitamin D3 5000 IU (125 mcg) daily Vitamin C 500 mg twice daily Zinc 50 to 75 mg daily   Skin infection due to pustule/shaving. Use new razors. Bactrim given. Dial soap to wash with.

## 2020-10-06 ENCOUNTER — Encounter: Payer: Self-pay | Admitting: Neurology

## 2020-10-06 ENCOUNTER — Encounter: Payer: Self-pay | Admitting: Gastroenterology

## 2020-10-06 LAB — SARS-COV-2, NAA 2 DAY TAT

## 2020-10-06 LAB — NOVEL CORONAVIRUS, NAA: SARS-CoV-2, NAA: NOT DETECTED

## 2020-10-06 NOTE — Progress Notes (Signed)
Patient asked for note to be sent to employer. Note written and faxed to [phone] with confirmation received.

## 2020-10-06 NOTE — Progress Notes (Signed)
Negative for covid

## 2020-10-17 ENCOUNTER — Ambulatory Visit: Payer: Commercial Managed Care - PPO

## 2020-10-17 ENCOUNTER — Other Ambulatory Visit: Payer: Self-pay

## 2020-10-17 VITALS — Ht 71.0 in | Wt 195.0 lb

## 2020-10-17 DIAGNOSIS — Z01818 Encounter for other preprocedural examination: Secondary | ICD-10-CM

## 2020-10-17 DIAGNOSIS — Z1211 Encounter for screening for malignant neoplasm of colon: Secondary | ICD-10-CM

## 2020-10-17 MED ORDER — PLENVU 140 G PO SOLR: 1.0000 | ORAL | 0 refills | Status: DC

## 2020-10-17 NOTE — Progress Notes (Signed)
Pt verified name, DOB, address and insurance during PV today.    Pt mailed instruction packet to included paper to complete and mail back to [facility] with addressed and stamped envelope,  copy of consent form to read and not return, and instructions. Plenvu coupon mailed in packet. PV completed over the phone. Pt encouraged to call with questions or issues   Self report wt [phone]  No allergies to soy or egg Pt is not on blood thinners or diet pills Denies issues with sedation/intubation Denies atrial flutter/fib Denies constipation    Pt is aware of Covid safety and care partner requirements.

## 2020-10-25 ENCOUNTER — Other Ambulatory Visit: Payer: Self-pay

## 2020-10-25 ENCOUNTER — Ambulatory Visit: Payer: Commercial Managed Care - PPO | Admitting: Physician Assistant

## 2020-10-25 VITALS — BP 145/77 | HR 87 | Ht 71.0 in | Wt 196.0 lb

## 2020-10-25 DIAGNOSIS — B029 Zoster without complications: Secondary | ICD-10-CM

## 2020-10-25 MED ORDER — IBUPROFEN 800 MG PO TABS: 800.0000 mg | ORAL_TABLET | Freq: Three times a day (TID) | ORAL | 0 refills | Status: DC | PRN

## 2020-10-25 MED ORDER — VALACYCLOVIR HCL 1 G PO TABS: 1000.0000 mg | ORAL_TABLET | Freq: Three times a day (TID) | ORAL | 0 refills | Status: DC

## 2020-10-25 NOTE — Patient Instructions (Signed)
Shingles  Shingles, which is also known as herpes zoster, is an infection that causes a painful skin rash and fluid-filled blisters. It is caused by a virus. Shingles only develops in people who:  Have had chickenpox.  Have been given a medicine to protect against chickenpox (have been vaccinated). Shingles is rare in this group. What are the causes? Shingles is caused by varicella-zoster virus (VZV). This is the same virus that causes chickenpox. After a person is exposed to VZV, the virus stays in the body in an inactive (dormant) state. Shingles develops if the virus is reactivated. This can happen many years after the first (initial) exposure to VZV. It is not known what causes this virus to be reactivated. What increases the risk? People who have had chickenpox or received the chickenpox vaccine are at risk for shingles. Shingles infection is more common in people who:  Are older than age 60.  Have a weakened disease-fighting system (immune system), such as people with: ? HIV. ? AIDS. ? Cancer.  Are taking medicines that weaken the immune system, such as transplant medicines.  Are experiencing a lot of stress. What are the signs or symptoms? Early symptoms of this condition include itching, tingling, and pain in an area on your skin. Pain may be described as burning, stabbing, or throbbing. A few days or weeks after early symptoms start, a painful red rash appears. The rash is usually on one side of the body and has a band-like or belt-like pattern. The rash eventually turns into fluid-filled blisters that break open, change into scabs, and dry up in about 2-3 weeks. At any time during the infection, you may also develop:  A fever.  Chills.  A headache.  An upset stomach. How is this diagnosed? This condition is diagnosed with a skin exam. Skin or fluid samples may be taken from the blisters before a diagnosis is made. These samples are examined under a microscope or sent to  a lab for testing. How is this treated? The rash may last for several weeks. There is not a specific cure for this condition. Your health care provider will probably prescribe medicines to help you manage pain, recover more quickly, and avoid long-term problems. Medicines may include:  Antiviral drugs.  Anti-inflammatory drugs.  Pain medicines.  Anti-itching medicines (antihistamines). If the area involved is on your face, you may be referred to a specialist, such as an eye doctor (ophthalmologist) or an ear, nose, and throat (ENT) doctor (otolaryngologist) to help you avoid eye problems, chronic pain, or disability. Follow these instructions at home: Medicines  Take over-the-counter and prescription medicines only as told by your health care provider.  Apply an anti-itch cream or numbing cream to the affected area as told by your health care provider. Relieving itching and discomfort  Apply cold, wet cloths (cold compresses) to the area of the rash or blisters as told by your health care provider.  Cool baths can be soothing. Try adding baking soda or dry oatmeal to the water to reduce itching. Do not bathe in hot water.   Blister and rash care  Keep your rash covered with a loose bandage (dressing). Wear loose-fitting clothing to help ease the pain of material rubbing against the rash.  Keep your rash and blisters clean by washing the area with mild soap and cool water as told by your health care provider.  Check your rash every day for signs of infection. Check for: ? More redness, swelling, or pain. ?   Fluid or blood. ? Warmth. ? Pus or a bad smell.  Do not scratch your rash or pick at your blisters. To help avoid scratching: ? Keep your fingernails clean and cut short. ? Wear gloves or mittens while you sleep, if scratching is a problem. General instructions  Rest as told by your health care provider.  Keep all follow-up visits as told by your health care provider. This  is important.  Wash your hands often with soap and water. If soap and water are not available, use hand sanitizer. Doing this lowers your chance of getting a bacterial skin infection.  Before your blisters change into scabs, your shingles infection can cause chickenpox in people who have never had it or have never been vaccinated against it. To prevent this from happening, avoid contact with other people, especially: ? Babies. ? Pregnant women. ? Children who have eczema. ? Elderly people who have transplants. ? People who have chronic illnesses, such as cancer or AIDS. Contact a health care provider if:  Your pain is not relieved with prescribed medicines.  Your pain does not get better after the rash heals.  You have signs of infection in the rash area, such as: ? More redness, swelling, or pain around the rash. ? Fluid or blood coming from the rash. ? The rash area feeling warm to the touch. ? Pus or a bad smell coming from the rash. Get help right away if:  The rash is on your face or nose.  You have facial pain, pain around your eye area, or loss of feeling on one side of your face.  You have difficulty seeing.  You have ear pain or have ringing in your ear.  You have a loss of taste.  Your condition gets worse. Summary  Shingles, which is also known as herpes zoster, is an infection that causes a painful skin rash and fluid-filled blisters.  This condition is diagnosed with a skin exam. Skin or fluid samples may be taken from the blisters and examined before the diagnosis is made.  Keep your rash covered with a loose bandage (dressing). Wear loose-fitting clothing to help ease the pain of material rubbing against the rash.  Before your blisters change into scabs, your shingles infection can cause chickenpox in people who have never had it or have never been vaccinated against it. This information is not intended to replace advice given to you by your health care  provider. Make sure you discuss any questions you have with your health care provider. Document Revised: 12/11/2018 Document Reviewed: 04/23/2017 Elsevier Patient Education  2021 Elsevier Inc.  

## 2020-10-27 ENCOUNTER — Encounter: Payer: Self-pay | Admitting: Physician Assistant

## 2020-10-27 NOTE — Progress Notes (Signed)
   Subjective:    Patient ID: Michael Meza, male    DOB: November 11, 1965, 55 y.o.   MRN: [id number]  HPI  Patient is a 55 year old man who comes in with a burning painful rash behind his left ear and going down his neck.  Symptoms started yesterday.  He has not done anything different.  He has not been outside in the woods.  He denies any fever, chills, headache, body aches, worsening cough.  He has not done anything to make better.  He has not had his shingles vaccines.  .. Active Ambulatory Problems    Diagnosis Date Noted  . Tobacco abuse 03/02/2014  . Gout 03/07/2014  . Essential hypertension, benign 03/07/2014  . Anxiety 08/31/2014  . Depression 08/31/2014  . Alcohol abuse 10/30/2016  . Arthritis of knee, left 02/25/2017  . Hemoptysis 03/24/2017  . Pleural effusion 03/24/2017  . Dry heaves 10/05/2017  . Upper abdominal pain 10/05/2017  . Gastroesophageal reflux disease with esophagitis 10/05/2017  . Hoarseness 10/05/2017  . Wheezing 10/05/2017  . Current smoker 10/05/2017  . 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 Left testicular pain 12/02/2019  . Status post hernia repair 03/10/2020  . Alcohol use 03/13/2020  . Encounter for screening for lung cancer 03/29/2020  . Aortic atherosclerosis (HCC) 05/25/2020  . Emphysema of lung (HCC) 05/25/2020   Resolved Ambulatory Problems    Diagnosis Date Noted  . Acute gout 03/07/2014  . Pain in the chest 03/07/2014  . Right foot pain 06/14/2014   Past Medical History:  Diagnosis Date  . Arthritis   . Cataract   . COPD (chronic obstructive pulmonary disease) (HCC)      Review of Systems See HPI.     Objective:   Physical Exam Vitals reviewed.  Constitutional:      Appearance: Normal appearance.  Cardiovascular:     Rate and Rhythm: Normal rate and regular rhythm.     Pulses: Normal pulses.  Pulmonary:     Effort: Pulmonary effort is normal.     Breath sounds: Normal breath sounds.  Skin:    Comments: Vesicles on  erythematous base in cluters left posterior ear and down left neck.   Neurological:     General: No focal deficit present.     Mental Status: He is alert and oriented to person, place, and time.  Psychiatric:        Mood and Affect: Mood normal.           Assessment & Plan:  Marland KitchenMarland KitchenSavaughn was seen today for skin problem.  Diagnoses and all orders for this visit:  Herpes zoster without complication -     valACYclovir (VALTREX) 1000 MG tablet; Take 1 tablet (1,000 mg total) by mouth 3 (three) times daily. -     ibuprofen (ADVIL) 800 MG tablet; Take 1 tablet (800 mg total) by mouth every 8 (eight) hours as needed.  Other orders -     Discontinue: valACYclovir (VALTREX) 1000 MG tablet; Take 1 tablet (1,000 mg total) by mouth 3 (three) times daily. -     Discontinue: ibuprofen (ADVIL) 800 MG tablet; Take 1 tablet (800 mg total) by mouth every 8 (eight) hours as needed.   Rash appears like shingles. He has not had vaccine. No culture tubes were available to test. Treated with valtrex. Ibuprofen for pain can alternate with tylenol. Encouraged cool compresses. Follow up as needed. HO given.

## 2020-11-03 ENCOUNTER — Other Ambulatory Visit: Payer: Self-pay | Admitting: Physician Assistant

## 2020-11-03 DIAGNOSIS — B029 Zoster without complications: Secondary | ICD-10-CM

## 2020-11-20 ENCOUNTER — Ambulatory Visit: Payer: Commercial Managed Care - PPO | Admitting: Physician Assistant

## 2020-11-27 ENCOUNTER — Encounter: Payer: Commercial Managed Care - PPO | Admitting: Gastroenterology

## 2020-12-19 ENCOUNTER — Encounter: Payer: Self-pay | Admitting: Gastroenterology

## 2020-12-19 ENCOUNTER — Telehealth: Payer: Self-pay | Admitting: Gastroenterology

## 2020-12-19 NOTE — Telephone Encounter (Signed)
Called to remind patient of procedure, patient stated he needed to reschedule, that he would be out of town.  Patient rescheduled for 01-26-21.

## 2020-12-20 NOTE — Telephone Encounter (Signed)
Thank you for the update!

## 2020-12-25 ENCOUNTER — Encounter: Payer: Commercial Managed Care - PPO | Admitting: Gastroenterology

## 2021-01-10 ENCOUNTER — Ambulatory Visit (AMBULATORY_SURGERY_CENTER): Payer: Commercial Managed Care - PPO

## 2021-01-10 ENCOUNTER — Other Ambulatory Visit: Payer: Self-pay

## 2021-01-10 VITALS — Ht 71.0 in | Wt 190.0 lb

## 2021-01-10 DIAGNOSIS — Z1211 Encounter for screening for malignant neoplasm of colon: Secondary | ICD-10-CM

## 2021-01-10 MED ORDER — PLENVU 140 G PO SOLR: 1.0000 | ORAL | 0 refills | Status: DC

## 2021-01-10 NOTE — Progress Notes (Signed)
Pre visit completed via phone call; Patient verified name, DOB, and address; No egg or soy allergy known to patient  No issues with past sedation with any surgeries or procedures Patient denies ever being told they had issues or difficulty with intubation  No FH of Malignant Hyperthermia No diet pills per patient No home 02 use per patient  No blood thinners per patient  Pt denies issues with constipation  No A fib or A flutter  EMMI video via MyChart  COVID 19 guidelines implemented in PV today with Pt and RN  NO PA's for preps discussed with pt in PV today  Discussed with pt there will be an out-of-pocket cost for prep and that varies from $0 to 70 dollars  Due to the COVID-19 pandemic we are asking patients to follow certain guidelines.  Pt aware of COVID protocols and [facility] guidelines   

## 2021-01-19 ENCOUNTER — Encounter: Payer: Self-pay | Admitting: Gastroenterology

## 2021-01-26 ENCOUNTER — Encounter: Payer: Commercial Managed Care - PPO | Admitting: Gastroenterology

## 2021-03-15 ENCOUNTER — Ambulatory Visit (AMBULATORY_SURGERY_CENTER): Payer: Commercial Managed Care - PPO | Admitting: *Deleted

## 2021-03-15 ENCOUNTER — Other Ambulatory Visit: Payer: Self-pay

## 2021-03-15 VITALS — Ht 71.0 in | Wt 190.0 lb

## 2021-03-15 DIAGNOSIS — Z1211 Encounter for screening for malignant neoplasm of colon: Secondary | ICD-10-CM

## 2021-03-15 MED ORDER — PLENVU 140 G PO SOLR: 1.0000 | Freq: Once | ORAL | 0 refills | Status: AC

## 2021-03-15 NOTE — Progress Notes (Signed)
Pt's previsit is done over the phone and all paperwork (prep instructions, blank consent form to just read over) sent to patient.  Pt's name and DOB verified at the beginning of the previsit.  Pt denies any difficulty with ambulating.    No trouble with anesthesia, denies trouble moving neck, tor hx/fam hx of malignant hyperthermia per pt   No egg or soy allergy  No home oxygen use   No medications for weight loss taken  emmi information given  Pt denies constipation issues  Pt informed that we do not do prior authorizations for prep  Plenvu coupon sent and code put into RX

## 2021-03-17 ENCOUNTER — Other Ambulatory Visit: Payer: Self-pay | Admitting: Physician Assistant

## 2021-03-17 DIAGNOSIS — I10 Essential (primary) hypertension: Secondary | ICD-10-CM

## 2021-03-29 ENCOUNTER — Encounter: Payer: Commercial Managed Care - PPO | Admitting: Gastroenterology

## 2021-03-29 ENCOUNTER — Telehealth: Payer: Self-pay | Admitting: Gastroenterology

## 2021-03-29 NOTE — Telephone Encounter (Signed)
Called patient at 12:55 to see if he would be coming for his procedure today.  No answer on his cell phone and home phone rang and went busy.

## 2021-04-03 NOTE — Telephone Encounter (Unsigned)
Noted  

## 2021-06-16 ENCOUNTER — Other Ambulatory Visit: Payer: Self-pay | Admitting: Physician Assistant

## 2021-06-16 DIAGNOSIS — I10 Essential (primary) hypertension: Secondary | ICD-10-CM

## 2021-07-02 ENCOUNTER — Telehealth: Payer: Self-pay | Admitting: Neurology

## 2021-07-02 DIAGNOSIS — I10 Essential (primary) hypertension: Secondary | ICD-10-CM

## 2021-07-02 MED ORDER — METOPROLOL TARTRATE 50 MG PO TABS: 50.0000 mg | ORAL_TABLET | Freq: Two times a day (BID) | ORAL | 0 refills | Status: DC

## 2021-07-02 NOTE — Telephone Encounter (Signed)
Patient left vm stating he is a [employer] and his truck is broken down in [place], it is being worked on but will not be in [place] to get his metoprolol until Sunday. He is asking for 10 pills to get him til his refill. RX sent. Patient made aware. Also aware he is due for a visit.

## 2021-08-09 ENCOUNTER — Other Ambulatory Visit: Payer: Self-pay | Admitting: Physician Assistant

## 2021-08-09 DIAGNOSIS — I10 Essential (primary) hypertension: Secondary | ICD-10-CM

## 2021-08-14 ENCOUNTER — Ambulatory Visit (INDEPENDENT_AMBULATORY_CARE_PROVIDER_SITE_OTHER): Payer: Self-pay | Admitting: Physician Assistant

## 2021-08-14 ENCOUNTER — Other Ambulatory Visit: Payer: Self-pay

## 2021-08-14 VITALS — BP 152/92 | HR 126 | Ht 71.0 in | Wt 192.0 lb

## 2021-08-14 DIAGNOSIS — Z1211 Encounter for screening for malignant neoplasm of colon: Secondary | ICD-10-CM

## 2021-08-14 DIAGNOSIS — Z122 Encounter for screening for malignant neoplasm of respiratory organs: Secondary | ICD-10-CM

## 2021-08-14 DIAGNOSIS — Z13220 Encounter for screening for lipoid disorders: Secondary | ICD-10-CM

## 2021-08-14 DIAGNOSIS — Z125 Encounter for screening for malignant neoplasm of prostate: Secondary | ICD-10-CM

## 2021-08-14 DIAGNOSIS — F17200 Nicotine dependence, unspecified, uncomplicated: Secondary | ICD-10-CM

## 2021-08-14 DIAGNOSIS — I700 Atherosclerosis of aorta: Secondary | ICD-10-CM

## 2021-08-14 DIAGNOSIS — M1A9XX0 Chronic gout, unspecified, without tophus (tophi): Secondary | ICD-10-CM

## 2021-08-14 DIAGNOSIS — Z23 Encounter for immunization: Secondary | ICD-10-CM

## 2021-08-14 DIAGNOSIS — I10 Essential (primary) hypertension: Secondary | ICD-10-CM

## 2021-08-14 DIAGNOSIS — Z1329 Encounter for screening for other suspected endocrine disorder: Secondary | ICD-10-CM

## 2021-08-14 DIAGNOSIS — Z0000 Encounter for general adult medical examination without abnormal findings: Secondary | ICD-10-CM

## 2021-08-14 DIAGNOSIS — J432 Centrilobular emphysema: Secondary | ICD-10-CM

## 2021-08-14 DIAGNOSIS — R748 Abnormal levels of other serum enzymes: Secondary | ICD-10-CM

## 2021-08-14 MED ORDER — ATORVASTATIN CALCIUM 10 MG PO TABS
10.0000 mg | ORAL_TABLET | Freq: Every day | ORAL | 3 refills | Status: DC
Start: 2021-08-14 — End: 2022-07-11

## 2021-08-14 MED ORDER — FLUTICASONE FUROATE-VILANTEROL 100-25 MCG/ACT IN AEPB: 1.0000 | INHALATION_SPRAY | Freq: Every day | RESPIRATORY_TRACT | 3 refills | Status: DC

## 2021-08-14 MED ORDER — ALLOPURINOL 100 MG PO TABS: ORAL_TABLET | ORAL | 3 refills | Status: DC

## 2021-08-14 MED ORDER — LOSARTAN POTASSIUM 100 MG PO TABS: 100.0000 mg | ORAL_TABLET | Freq: Every day | ORAL | 1 refills | Status: DC

## 2021-08-14 MED ORDER — METOPROLOL TARTRATE 50 MG PO TABS: 50.0000 mg | ORAL_TABLET | Freq: Two times a day (BID) | ORAL | 1 refills | Status: DC

## 2021-08-14 NOTE — Progress Notes (Addendum)
Subjective:    Patient ID: Michael Meza, male    DOB: 1966-07-16, 55 y.o.   MRN: [id number]  Hypertension Pertinent negatives include no chest pain, headaches, palpitations or shortness of breath. 55 y.o male with HTN, emphysema, aortic atherosclerosis and tobacco use disorder presenting to the clinic today for medication refills. Pt states that he has been out of his blood pressure medications for 3 days which may be attributing to his high blood pressure reading at today's visit. Admits to smoking before he walked in for his appointment. Reports no other problems at this time. States that it is his new years resolution to quit smoking. Denies chest pain, palpitations, increasing incidence of dyspnea, wheezing, or vision changes.   .. 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   Left testicular pain 12/02/2019   Status post hernia repair 03/10/2020   Alcohol use 03/13/2020   Encounter for screening for lung cancer 03/29/2020   Aortic atherosclerosis (HCC) 05/25/2020   Emphysema of lung (HCC) 05/25/2020   Abnormal liver enzymes 08/15/2021   Resolved Ambulatory Problems    Diagnosis Date Noted   Acute gout 03/07/2014   Pain in the chest 03/07/2014   Right foot pain 06/14/2014   Past Medical History:  Diagnosis Date   Arthritis    Cataract    COPD (chronic obstructive pulmonary disease) (HCC)        Review of Systems  Constitutional:  Negative for chills, fatigue, fever and  unexpected weight change.  Respiratory:  Negative for apnea, chest tightness, shortness of breath and wheezing.   Cardiovascular:  Negative for chest pain, palpitations and leg swelling.  Gastrointestinal:  Negative for abdominal pain.  Neurological:  Negative for dizziness, weakness and headaches.  .. Depression screen [facility] 2/9 08/15/2021 03/10/2020 05/18/2018  Decreased Interest 0 0 1  Down, Depressed, Hopeless 0 0 0  PHQ - 2 Score 0 0 1  Altered sleeping - 0 1  Tired, decreased energy - 0 1  Change in appetite - 0 0  Feeling bad or failure about yourself  - 0 1  Trouble concentrating - 0 0  Moving slowly or fidgety/restless - 0 0  Suicidal thoughts - 0 0  PHQ-9 Score - 0 4  Difficult doing work/chores - Not difficult at all Somewhat difficult       Objective:   Physical Exam Constitutional:      Appearance: Normal appearance.  HENT:     Head: Normocephalic and atraumatic.     Right Ear: Tympanic membrane and ear canal normal.     Left Ear: Tympanic membrane and ear canal normal.     Nose: Nose normal.     Mouth/Throat:     Mouth: Mucous membranes are moist.  Eyes:     Extraocular Movements: Extraocular movements intact.     Conjunctiva/sclera: Conjunctivae normal.  Cardiovascular:     Rate and Rhythm: Regular rhythm. Tachycardia present.  Pulmonary:     Effort: Pulmonary effort is normal. No respiratory distress.  Comments: Wheezing heard on ausculation of left lobe  Chest:     Chest wall: No tenderness.  Musculoskeletal:        General: Normal range of motion.     Right lower leg: No edema.     Left lower leg: No edema.  Neurological:     Mental Status: He is alert and oriented to person, place, and time.  Psychiatric:        Mood and Affect: Mood normal.        Behavior: Behavior normal.          Assessment & Plan:  Diagnoses and all orders for this visit: Essential hypertension, benign -     COMPLETE METABOLIC PANEL WITH GFR -     losartan (COZAAR)  100 MG tablet; Take 1 tablet (100 mg total) by mouth daily. -     metoprolol tartrate (LOPRESSOR) 50 MG tablet; Take 1 tablet (50 mg total) by mouth 2 (two) times daily. Abnormal liver enzymes -     COMPLETE METABOLIC PANEL WITH GFR Chronic gout without tophus, unspecified cause, unspecified site -     Uric acid -     allopurinol (ZYLOPRIM) 100 MG tablet; TAKE 1 TABLET(100 MG) BY MOUTH TWICE DAILY Prostate cancer screening -     PSA Routine physical examination -     PSA -     TSH -     Lipid Panel w/reflex Direct LDL -     COMPLETE METABOLIC PANEL WITH GFR -     CBC with Differential/Platelet -     Uric acid Lipid screening -     Lipid Panel w/reflex Direct LDL Thyroid disorder screen -     TSH Aortic atherosclerosis (HCC) -     atorvastatin (LIPITOR) 10 MG tablet; Take 1 tablet (10 mg total) by mouth daily. Centrilobular emphysema (HCC) -     fluticasone furoate-vilanterol (BREO ELLIPTA) 100-25 MCG/ACT AEPB; Inhale 1 puff into the lungs daily. Colon cancer screening -     Ambulatory referral to Gastroenterology Screening for lung cancer -     Ambulatory Referral for Lung Cancer Scre Tobacco dependence -     Ambulatory Referral for Lung Cancer Scre -     Pneumococcal polysaccharide vaccine 23-valent greater than or equal to 2yo subcutaneous/IM Need for pneumococcal vaccination -     Pneumococcal polysaccharide vaccine 23-valent greater than or equal to 2yo subcutaneous/IM   -Losartan increased to 100 MG due to elevated BP readings today.  (Follow up in 2 weeks for BP check with nurse visit) -Colonoscopy ordered. -Chest CT ordered for lung cancer screening.  -Educated pt on importance of tobacco cessation and reducing alcohol intake.  -PPSV23 given at today's visit.  Pt declines flu/covid/shingles vaccine  -Pt will come to get labs drawn when fasting.  -Follow-up in 6 months.   Marland KitchenHarlon Flor PA-C, have reviewed and agree with the above documentation in it's entirety.

## 2021-08-15 ENCOUNTER — Encounter: Payer: Self-pay | Admitting: Physician Assistant

## 2021-08-15 DIAGNOSIS — R748 Abnormal levels of other serum enzymes: Secondary | ICD-10-CM | POA: Insufficient documentation

## 2021-08-24 ENCOUNTER — Telehealth: Payer: Self-pay

## 2021-08-24 NOTE — Telephone Encounter (Signed)
Medication: fluticasone furoate-vilanterol (BREO ELLIPTA) 100-25 MCG/ACT AEPB Prior authorization submitted via CoverMyMeds on 08/24/2021 PA submission pending

## 2021-08-24 NOTE — Telephone Encounter (Signed)
Medication: fluticasone furoate-vilanterol (BREO ELLIPTA) 100-25 MCG/ACT AEPB Prior authorization determination received Medication has been denied Reason for denial:  Name brand is covered, not generic

## 2021-09-04 ENCOUNTER — Other Ambulatory Visit: Payer: Self-pay | Admitting: Physician Assistant

## 2021-09-10 ENCOUNTER — Other Ambulatory Visit: Payer: Self-pay | Admitting: Neurology

## 2021-09-10 DIAGNOSIS — Z79899 Other long term (current) drug therapy: Secondary | ICD-10-CM

## 2021-09-10 DIAGNOSIS — L700 Acne vulgaris: Secondary | ICD-10-CM

## 2021-09-10 NOTE — Progress Notes (Signed)
Dermatology wanted GGT drawn due to the type of medication they would like to use and its affect on the liver. Okay to order per [facility].

## 2021-09-10 NOTE — Addendum Note (Signed)
Addended byAnnamaria Helling on: 09/10/2021 02:44 PM   Modules accepted: Orders

## 2021-09-11 LAB — COMPLETE METABOLIC PANEL WITH GFR
AG Ratio: 1.9 (calc) (ref 1.0–2.5)
ALT: 20 U/L (ref 9–46)
AST: 23 U/L (ref 10–35)
Albumin: 4.6 g/dL (ref 3.6–5.1)
Alkaline phosphatase (APISO): 66 U/L (ref 35–144)
BUN: 11 mg/dL (ref 7–25)
CO2: 23 mmol/L (ref 20–32)
Calcium: 9.4 mg/dL (ref 8.6–10.3)
Chloride: 106 mmol/L (ref 98–110)
Creat: 0.8 mg/dL (ref 0.70–1.30)
Globulin: 2.4 g/dL (calc) (ref 1.9–3.7)
Glucose, Bld: 96 mg/dL (ref 65–99)
Potassium: 4.7 mmol/L (ref 3.5–5.3)
Sodium: 138 mmol/L (ref 135–146)
Total Bilirubin: 0.8 mg/dL (ref 0.2–1.2)
Total Protein: 7 g/dL (ref 6.1–8.1)
eGFR: 104 mL/min/{1.73_m2} (ref >=60)

## 2021-09-11 LAB — CBC WITH DIFFERENTIAL/PLATELET
Absolute Monocytes: 685 cells/uL (ref 200–950)
Basophils Absolute: 32 cells/uL (ref 0–200)
Basophils Relative: 0.5 %
Eosinophils Absolute: 122 cells/uL (ref 15–500)
Eosinophils Relative: 1.9 %
HCT: 49.5 % (ref 38.5–50.0)
Hemoglobin: 17 g/dL (ref 13.2–17.1)
Lymphs Abs: 1677 cells/uL (ref 850–3900)
MCH: 34.7 pg — ABNORMAL HIGH (ref 27.0–33.0)
MCHC: 34.3 g/dL (ref 32.0–36.0)
MCV: 101 fL — ABNORMAL HIGH (ref 80.0–100.0)
MPV: 10.7 fL (ref 7.5–12.5)
Monocytes Relative: 10.7 %
Neutro Abs: 3885 cells/uL (ref 1500–7800)
Neutrophils Relative %: 60.7 %
Platelets: 208 10*3/uL (ref 140–400)
RBC: 4.9 10*6/uL (ref 4.20–5.80)
RDW: 12.3 % (ref 11.0–15.0)
Total Lymphocyte: 26.2 %
WBC: 6.4 10*3/uL (ref 3.8–10.8)

## 2021-09-11 LAB — PSA: PSA: 0.47 ng/mL (ref <=4.00)

## 2021-09-11 LAB — LIPID PANEL W/REFLEX DIRECT LDL
Cholesterol: 131 mg/dL (ref <200)
HDL: 68 mg/dL (ref >=40)
LDL Cholesterol (Calc): 50 mg/dL (calc)
Non-HDL Cholesterol (Calc): 63 mg/dL (calc) (ref <130)
Total CHOL/HDL Ratio: 1.9 (calc) (ref <5.0)
Triglycerides: 44 mg/dL (ref <150)

## 2021-09-11 LAB — TSH: TSH: 0.99 mIU/L (ref 0.40–4.50)

## 2021-09-11 LAB — URIC ACID: Uric Acid, Serum: 6 mg/dL (ref 4.0–8.0)

## 2021-09-11 NOTE — Progress Notes (Signed)
Make sure this is faxed to specialist that requested with dermatology.

## 2021-09-11 NOTE — Progress Notes (Signed)
Michael Meza,   Prostate looks good.  Thyroid normal range.  Cholesterol great.  Kidney, liver, glucose look amazing.  Uric acid in normal range.  RBC a little big which can be b12/folate related or due to drinking too much alcohol. Are you drinking alcohol? If so how much?   Please add b12/folate to evaluate.

## 2021-09-12 LAB — B12 AND FOLATE PANEL: Vitamin B-12: 289 pg/mL (ref 200–1100)

## 2021-09-12 LAB — GAMMA GT: GGT: 29 U/L (ref 3–85)

## 2021-09-12 NOTE — Progress Notes (Signed)
B12 is low. Need to be taking a day.

## 2021-10-16 ENCOUNTER — Telehealth: Payer: Self-pay | Admitting: Acute Care

## 2021-10-16 NOTE — Telephone Encounter (Signed)
Left voicemail with call back number to schedule LDCT

## 2022-02-08 ENCOUNTER — Telehealth: Payer: Self-pay | Admitting: Physician Assistant

## 2022-02-08 NOTE — Telephone Encounter (Signed)
Pt called. Please change current pharmacy on file to [facility] on [address].  Thank you.

## 2022-02-08 NOTE — Telephone Encounter (Signed)
Pharmacy updated.

## 2022-02-12 ENCOUNTER — Ambulatory Visit: Payer: No Typology Code available for payment source | Admitting: Physician Assistant

## 2022-02-22 ENCOUNTER — Telehealth: Payer: Self-pay | Admitting: Neurology

## 2022-02-22 DIAGNOSIS — J432 Centrilobular emphysema: Secondary | ICD-10-CM

## 2022-02-22 DIAGNOSIS — I10 Essential (primary) hypertension: Secondary | ICD-10-CM

## 2022-02-22 MED ORDER — METOPROLOL TARTRATE 50 MG PO TABS: 50.0000 mg | ORAL_TABLET | Freq: Two times a day (BID) | ORAL | 0 refills | Status: DC

## 2022-02-22 MED ORDER — FLUTICASONE FUROATE-VILANTEROL 100-25 MCG/ACT IN AEPB: 1.0000 | INHALATION_SPRAY | Freq: Every day | RESPIRATORY_TRACT | 1 refills | Status: DC

## 2022-02-22 NOTE — Telephone Encounter (Signed)
Patient left vm wanting refills of Metoprolol and Breo inhaler to [facility] on [address]. RX sent. Needs appt. Called patient with no answer, no vm.

## 2022-02-25 ENCOUNTER — Other Ambulatory Visit: Payer: Self-pay | Admitting: Physician Assistant

## 2022-02-25 DIAGNOSIS — I10 Essential (primary) hypertension: Secondary | ICD-10-CM

## 2022-02-25 MED ORDER — LOSARTAN POTASSIUM 100 MG PO TABS: 100.0000 mg | ORAL_TABLET | Freq: Every day | ORAL | 0 refills | Status: DC

## 2022-02-28 ENCOUNTER — Emergency Department (INDEPENDENT_AMBULATORY_CARE_PROVIDER_SITE_OTHER): Payer: No Typology Code available for payment source

## 2022-02-28 ENCOUNTER — Emergency Department
Admission: EM | Admit: 2022-02-28 | Discharge: 2022-02-28 | Disposition: A | Discharge status: Home / Self Care | Payer: No Typology Code available for payment source | Source: Home / Self Care | Attending: Family Medicine | Admitting: Family Medicine

## 2022-02-28 DIAGNOSIS — I10 Essential (primary) hypertension: Secondary | ICD-10-CM

## 2022-02-28 DIAGNOSIS — N5089 Other specified disorders of the male genital organs: Secondary | ICD-10-CM

## 2022-02-28 DIAGNOSIS — I861 Scrotal varices: Secondary | ICD-10-CM | POA: Diagnosis not present

## 2022-02-28 DIAGNOSIS — N50819 Testicular pain, unspecified: Secondary | ICD-10-CM

## 2022-02-28 DIAGNOSIS — N433 Hydrocele, unspecified: Secondary | ICD-10-CM

## 2022-02-28 MED ORDER — ONDANSETRON 4 MG PO TBDP
4.0000 mg | ORAL_TABLET | Freq: Once | ORAL | Status: AC
  Administered 2022-02-28: 4 mg via ORAL

## 2022-02-28 MED ORDER — CHLORTHALIDONE 25 MG PO TABS: 25.0000 mg | ORAL_TABLET | Freq: Every day | ORAL | 0 refills | Status: DC

## 2022-02-28 NOTE — Discharge Instructions (Signed)
Take the fluid pill once a day Reduce the salt in your diet Elevate your scrotum See Lesly Rubenstein as scheduled

## 2022-02-28 NOTE — ED Provider Notes (Signed)
[facility]    CSN: [id number] Arrival date & time: 02/28/22  1216      History   Chief Complaint Chief Complaint  Patient presents with   genital swelling    HPI Michael Meza is a 56 y.o. male.   HPI  Pleasant 56 year old male.  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  He also states "I smoke too much I drink too much".  He has swelling in his testicles.  Right greater than left.  They are painful and tender.  No dysuria or urinary problems.  He states that he thinks he may have fallen while he was sleepwalking and injured his testicles on the footboard of his bed.  I explained to him that if he actually landed on the footboard, straddled, and bruised his testicles I think he would have awakened.  Patient states "I sleep hard".  Past Medical History:  Diagnosis Date   Alcohol abuse 10/30/2016   Arthritis    Cataract    bilateral repair   COPD (chronic obstructive pulmonary disease) (HCC)    Depression 08/31/2014   Essential hypertension, benign 03/07/2014   Gout 03/07/2014   Not currently on treatment for prevention.      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   Dry heaves 10/05/2017   Upper abdominal pain 10/05/2017   Gastroesophageal reflux disease with esophagitis 10/05/2017   Hoarseness 10/05/2017   Wheezing 10/05/2017   Tobacco dependence 10/05/2017   Hemoptysis 03/24/2017   Pleural effusion 03/24/2017   Arthritis of knee, left 02/25/2017   Alcohol abuse 10/30/2016   Anxiety 08/31/2014   Depression 08/31/2014   Gout 03/07/2014   Essential hypertension, benign 03/07/2014   Tobacco abuse 03/02/2014     Past Surgical History:  Procedure Laterality Date   APPENDECTOMY     CATARACT EXTRACTION Bilateral    cateract surgery     ganlion cyst      HERNIA REPAIR         Home Medications    Prior to Admission medications   Medication Sig Start Date End Date Taking? Authorizing Provider  chlorthalidone (HYGROTON) 25 MG tablet Take 1 tablet (25 mg total) by mouth daily. 02/28/22  Yes Eustace Moore, MD  albuterol (VENTOLIN HFA) 108 (90 Base) MCG/ACT inhaler Inhale 2 puffs into the lungs every 6 (six) hours as needed. Patient not taking: Reported on 02/28/2022 03/10/20   Jomarie Longs, PA-C  allopurinol (ZYLOPRIM) 100 MG tablet TAKE 1 TABLET(100 MG) BY MOUTH TWICE DAILY 08/14/21   Breeback, [name] L, PA-C  atorvastatin (LIPITOR) 10 MG tablet Take 1 tablet (10 mg total) by mouth daily. 08/14/21   Breeback, [name] L, PA-C  fluticasone furoate-vilanterol (BREO ELLIPTA) 100-25 MCG/ACT AEPB Inhale 1 puff into the lungs daily. 02/22/22   Breeback, [name] L, PA-C  losartan (COZAAR) 100 MG tablet Take 1 tablet (100 mg total) by mouth daily. Needs appt 02/25/22   Jomarie Longs, PA-C  metoprolol tartrate (LOPRESSOR) 50 MG tablet Take 1 tablet (50 mg total) by mouth 2 (two) times daily. 02/22/22   Jomarie Longs, PA-C  Family History Family History  Problem Relation Age of Onset   Diabetes Brother    Colon cancer Neg Hx    Colon polyps Neg Hx    Esophageal cancer Neg Hx    Rectal cancer Neg Hx    Stomach cancer Neg Hx     Social History Social History   Tobacco Use   Smoking status: Every Day    Packs/day: 2.00    Years: 39.00    Total pack years: 78.00    Types: Cigarettes   Smokeless tobacco: Never  Vaping Use   Vaping Use: Former  Substance Use Topics   Alcohol use: Yes    Alcohol/week: 21.0 standard drinks of alcohol    Types: 21 Cans of beer per week    Comment: pt reports he has cut back a lot.    Drug use: No     Allergies   Prozac [fluoxetine hcl]   Review of  Systems Review of Systems See HPI  Physical Exam Triage Vital Signs ED Triage Vitals  Enc Vitals Group     BP 02/28/22 1223 (!) 176/91     Pulse Rate 02/28/22 1223 65     Resp 02/28/22 1223 14     Temp 02/28/22 1223 97.9 F (36.6 C)     Temp Source 02/28/22 1223 Oral     SpO2 02/28/22 1223 97 %     Weight --      Height --      Head Circumference --      Peak Flow --      Pain Score 02/28/22 1225 0     Pain Loc --      Pain Edu? --      Excl. in GC? --    No data found.  Updated Vital Signs BP (!) 176/91 (BP Location: Left Arm)   Pulse 65   Temp 97.9 F (36.6 C) (Oral)   Resp 14   SpO2 97%      Physical Exam Constitutional:      General: He is not in acute distress.    Appearance: Normal appearance. He is well-developed.  HENT:     Head: Normocephalic and atraumatic.  Eyes:     Conjunctiva/sclera: Conjunctivae normal.     Pupils: Pupils are equal, round, and reactive to light.  Cardiovascular:     Rate and Rhythm: Normal rate.  Pulmonary:     Effort: Pulmonary effort is normal. No respiratory distress.  Abdominal:     General: There is no distension.     Palpations: Abdomen is soft.  Genitourinary:    Comments: Normal circumcised penis.  No lesion.  No discharge.  Testicles are normal in shape and size.  There is bogginess of the scrotum and enlargement that is diffuse.  No hernia identified.  No mass identified Musculoskeletal:        General: Normal range of motion.     Cervical back: Normal range of motion.     Right lower leg: Edema present.     Left lower leg: Edema present.  Skin:    General: Skin is warm and dry.  Neurological:     General: No focal deficit present.     Mental Status: He is alert.  Psychiatric:        Mood and Affect: Mood normal.        Behavior: Behavior normal.      UC Treatments / Results  Labs (all labs ordered are listed, but only abnormal  results are displayed) Labs Reviewed - No data to  display  EKG   Radiology US Scrotum  Result Date: 02/28/2022 CLINICAL DATA:  Testicular pain and swelling for 3 days. EXAM: SCROTAL ULTRASOUND DOPPLER ULTRASOUND OF THE TESTICLES TECHNIQUE: Complete ultrasound examination of the testicles, epididymis, and other scrotal structures was performed. Color and spectral Doppler ultrasound were also utilized to evaluate blood flow to the testicles. COMPARISON:  Sonogram dated December 02, 2019 FINDINGS: Right testicle Measurements: 4.7 x 2.6 x 3.6. No mass or microlithiasis visualized. Left testicle Measurements: 4.6 x 2.4 x 3.1. No mass or microlithiasis visualized. Right epididymis:  Normal in size and appearance. Left epididymis:  Normal in size and appearance. Hydrocele:  Small bilateral hydrocele Varicocele: Prominence of the pampiniform veins on Valsalva suggesting mild bilateral varicocele. Pulsed Doppler interrogation of both testes demonstrates normal low resistance arterial and venous waveforms bilaterally. There is edema and scrotal skin thickening, which may be secondary to volume overload. IMPRESSION: 1.  No evidence of testicular torsion or mass. 2.  Small bilateral hydrocele and varicoceles. 3. Scrotal skin thickening and edema, clinical correlation for volume overload is suggested. Electronically Signed   By: Keane Police D.O.   On: 02/28/2022 14:16    Procedures Procedures (including critical care time)  Medications Ordered in UC Medications  ondansetron (ZOFRAN-ODT) disintegrating tablet 4 mg (4 mg Oral Given 02/28/22 1423)    Initial Impression / Assessment and Plan / UC Course  I have reviewed the triage vital signs and the nursing notes.  Pertinent labs & imaging results that were available during my care of the patient were reviewed by me and considered in my medical decision making (see chart for details).    The hydrocele and varicocele are approximately no consequence.  I do not think they contribute significantly to the  enlargement of his scrotum.  I think it is mostly fluid overload.  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  He does not get out and stretch or reposition frequently.  I have concern that he also has some pitting edema bilaterally.  This with fluid overload of the scrotum could be a more serious problem regarding vascular supply, heart failure, cirrhosis.  He states that he is seeing his primary care doctor in about a week and a half.  He is encouraged to keep this follow-up. He did have lab work in January.  It was largely normal, good liver functions, normal electrolytes.  High MCV consistent with his history of drinking I am starting him on a thiazide diuretic.  He will need electrolytes follow-up.  Final Clinical Impressions(s) / UC Diagnoses   Final diagnoses:  Swelling of the testicles  Hydrocele in adult  Varicocele palpable with Valsalva maneuver only     Discharge Instructions      Take the fluid pill once a day Reduce the salt in your diet Elevate your scrotum See Luvenia Starch as scheduled      ED Prescriptions     Medication Sig Dispense Auth. Provider   chlorthalidone (HYGROTON) 25 MG tablet Take 1 tablet (25 mg total) by mouth daily. 30 tablet Raylene Everts, MD      PDMP not reviewed this encounter.   Raylene Everts, MD 02/28/22 [phone]

## 2022-02-28 NOTE — ED Triage Notes (Signed)
Pt presents with genital swelling that began Monday morning.

## 2022-03-11 ENCOUNTER — Ambulatory Visit: Payer: No Typology Code available for payment source | Admitting: Physician Assistant

## 2022-05-01 ENCOUNTER — Other Ambulatory Visit: Payer: Self-pay | Admitting: Physician Assistant

## 2022-05-01 DIAGNOSIS — I10 Essential (primary) hypertension: Secondary | ICD-10-CM

## 2022-06-22 ENCOUNTER — Other Ambulatory Visit: Payer: Self-pay | Admitting: Physician Assistant

## 2022-06-22 DIAGNOSIS — I10 Essential (primary) hypertension: Secondary | ICD-10-CM

## 2022-06-26 ENCOUNTER — Other Ambulatory Visit: Payer: Self-pay | Admitting: Physician Assistant

## 2022-06-26 DIAGNOSIS — I10 Essential (primary) hypertension: Secondary | ICD-10-CM

## 2022-07-11 ENCOUNTER — Telehealth: Payer: Self-pay

## 2022-07-11 DIAGNOSIS — I10 Essential (primary) hypertension: Secondary | ICD-10-CM

## 2022-07-11 DIAGNOSIS — I700 Atherosclerosis of aorta: Secondary | ICD-10-CM

## 2022-07-11 MED ORDER — LOSARTAN POTASSIUM 100 MG PO TABS: ORAL_TABLET | ORAL | 0 refills | Status: DC

## 2022-07-11 MED ORDER — ATORVASTATIN CALCIUM 10 MG PO TABS: 10.0000 mg | ORAL_TABLET | Freq: Every day | ORAL | 0 refills | Status: DC

## 2022-07-11 MED ORDER — METOPROLOL TARTRATE 50 MG PO TABS: ORAL_TABLET | ORAL | 0 refills | Status: DC

## 2022-07-11 NOTE — Telephone Encounter (Signed)
I sent one month refill for all medications. He needs to schedule an appt for any further refills. He needs to go ahead and get something scheduled. Please call and let him know.

## 2022-07-11 NOTE — Telephone Encounter (Signed)
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 Patient would like to see if he could get his metoprolol, atorvastatin, and losartan, please advise, thanks!

## 2022-07-11 NOTE — Addendum Note (Signed)
Addended bySilvio Pate on: 07/11/2022 04:47 PM   Modules accepted: Orders

## 2022-07-29 ENCOUNTER — Ambulatory Visit: Payer: No Typology Code available for payment source | Admitting: Physician Assistant

## 2022-07-30 ENCOUNTER — Telehealth: Payer: Self-pay

## 2022-07-30 ENCOUNTER — Ambulatory Visit (INDEPENDENT_AMBULATORY_CARE_PROVIDER_SITE_OTHER): Payer: Self-pay | Admitting: Family Medicine

## 2022-07-30 ENCOUNTER — Ambulatory Visit: Payer: Self-pay | Admitting: Physician Assistant

## 2022-07-30 ENCOUNTER — Encounter: Payer: Self-pay | Admitting: Family Medicine

## 2022-07-30 DIAGNOSIS — M1A9XX0 Chronic gout, unspecified, without tophus (tophi): Secondary | ICD-10-CM

## 2022-07-30 DIAGNOSIS — I10 Essential (primary) hypertension: Secondary | ICD-10-CM

## 2022-07-30 MED ORDER — CHLORTHALIDONE 25 MG PO TABS: 25.0000 mg | ORAL_TABLET | Freq: Every day | ORAL | 0 refills | Status: DC

## 2022-07-30 MED ORDER — LOSARTAN POTASSIUM 100 MG PO TABS: ORAL_TABLET | ORAL | 0 refills | Status: DC

## 2022-07-30 MED ORDER — ALLOPURINOL 100 MG PO TABS: ORAL_TABLET | ORAL | 0 refills | Status: DC

## 2022-07-30 MED ORDER — METOPROLOL TARTRATE 50 MG PO TABS: ORAL_TABLET | ORAL | 0 refills | Status: DC

## 2022-07-30 NOTE — Progress Notes (Signed)
Established Patient Office Visit  Subjective   Patient ID: Michael Meza, male    DOB: 01-31-1966  Age: 56 y.o. MRN: [id number]  Chief Complaint  Patient presents with   Hypertension    Patient in office for HTN f/u - requesitng rx rf of BP medicaiton- states the Maurilio Lovely is over $500 and he can not afford this. He does not currently have insurance. Wondering if similar cheaper med could be prescribed ?     HPI Pt reports he has HTN and is out of BP medicines (Losartan, Metoprolol, and Chlorthalidone). He says he has no insurance and would rather wait on lab work to be done  until next month. He says he plans to make an appointment for this. Blood pressure initially elevated. Rechecked at the end of visit and stable/at goal.   He has COPD and still smokes. He reports he is trying to stop. Unable to afford the [facility]. He says he can wait until next month so he can get the [facility]. He uses his Albuterol once a day if that.   Has hx of Gout. Stable on Allopurinol. Would like this refilled.   Review of Systems  All other systems reviewed and are negative.     Objective:     BP 138/76   Pulse 74   Temp (!) 97.5 F (36.4 C)   Ht 5\' 11"  (1.803 m)   Wt 190 lb 5 oz (86.3 kg)   SpO2 96%   BMI 26.54 kg/m    Physical Exam Vitals and nursing note reviewed.  Constitutional:      Appearance: Normal appearance. He is normal weight.  HENT:     Head: Normocephalic and atraumatic.     Right Ear: Tympanic membrane, ear canal and external ear normal.     Left Ear: Tympanic membrane, ear canal and external ear normal.     Nose: Nose normal.     Mouth/Throat:     Mouth: Mucous membranes are moist.     Pharynx: Oropharynx is clear.  Eyes:     Conjunctiva/sclera: Conjunctivae normal.     Pupils: Pupils are equal, round, and reactive to light.  Cardiovascular:     Rate and Rhythm: Normal rate and regular rhythm.     Pulses: Normal pulses.     Heart sounds: Normal heart sounds.   Pulmonary:     Effort: Pulmonary effort is normal.     Breath sounds: Wheezing present.  Abdominal:     General: Abdomen is flat. Bowel sounds are normal.  Neurological:     General: No focal deficit present.     Mental Status: He is alert. Mental status is at baseline.  Psychiatric:        Mood and Affect: Mood normal.        Thought Content: Thought content normal.     No results found for any visits on 07/30/22.      Assessment & Plan:   Problem List Items Addressed This Visit       Cardiovascular and Mediastinum   Essential hypertension, benign   Relevant Medications   losartan (COZAAR) 100 MG tablet   metoprolol tartrate (LOPRESSOR) 50 MG tablet   chlorthalidone (HYGROTON) 25 MG tablet     Other   Gout   Relevant Medications   allopurinol (ZYLOPRIM) 100 MG tablet   Refilled chronic medicines for HTN. Advised pt is in need for routine lab work to include liver and kidney function. Is is self  pay and would like to wait until next month since he'll be getting insurance. He reports he [place] of [place] and it's difficult for him to make appointments.  I have refilled for 1 month only and advised him to go ahead and make appointment with PCP  Refilled medicine for gout Use Albuterol prn for COPD. He says he will wait until he gets insurance before trying to get the [facility] inhaler.   Return in about 6 weeks (around 09/10/2022) for Hypertension.    Suzan Slick, MD

## 2022-07-30 NOTE — Telephone Encounter (Signed)
Task completed. Patient has been scheduled at 110 pm with Dr. Wyline Mood. Patient made aware no refills will be authorized until seen in the office. Patient has confirmed to keep his upcoming appt.

## 2022-07-30 NOTE — Telephone Encounter (Signed)
Patient no showed appt yesterday and was r/s to today (but had to be cancelled because Lesly Rubenstein is out of the office)  He was last seen December of last year. Have given multiple small refills and patient has not kept appts. Please advise.

## 2022-07-30 NOTE — Telephone Encounter (Signed)
Patient had an appointment with Michael Meza this morning and couldn't make another appointment before going out of town and wanted to know if he can at least get about 15 pills of each pill formed Rx to get him by until he's back in town. Please Advise?

## 2022-07-30 NOTE — Telephone Encounter (Signed)
Let's Get him scheduled and then I will give him enough until his appointment.

## 2022-08-01 ENCOUNTER — Other Ambulatory Visit: Payer: Self-pay | Admitting: Neurology

## 2022-08-01 DIAGNOSIS — I700 Atherosclerosis of aorta: Secondary | ICD-10-CM

## 2022-08-01 MED ORDER — ATORVASTATIN CALCIUM 10 MG PO TABS: 10.0000 mg | ORAL_TABLET | Freq: Every day | ORAL | 0 refills | Status: DC

## 2022-08-01 NOTE — Telephone Encounter (Signed)
Thanks so much! Patient is aware to stay off medication and contact us if anything changes. He is aware he needs labs after this fill of Atorvastatin.

## 2022-08-01 NOTE — Telephone Encounter (Signed)
Patient called, he states Chlorthalidone was sent to the pharmacy and he does not take this medication. Patient was seen by Dr. Wyline Mood in [place] absence.   Looking back, it looks like he was given this at urgent care when he was having some swelling issues for a one month supply in June 2023. 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 I advised him to not take medication for now and I would send a note to Dr. Wyline Mood to confirm that that is correct.   He will continue all other medications. He does need a refill on cholesterol medication and I have sent this to [facility] for him.   Dr. Wyline Mood - please advise on Chlorthalidone. Thanks!

## 2022-08-01 NOTE — Telephone Encounter (Signed)
Medications were verified during triage and repeated during visit as medicines that were needed to be refilled. They were on his list as taking and wasn't denied during visit that he wasn't taking. If he's not taking chronically, may disregard this one. I just refilled chronic medicines in the absence of PCP, I didn't start anything. Refilled Atorvastatin.  I have removed Chlorthalidone from his medicine list since he says he's not taking.

## 2022-08-02 ENCOUNTER — Telehealth: Payer: Self-pay | Admitting: Family Medicine

## 2022-08-02 MED ORDER — FLUTICASONE-SALMETEROL 250-50 MCG/ACT IN AEPB
1.0000 | INHALATION_SPRAY | Freq: Two times a day (BID) | RESPIRATORY_TRACT | 1 refills | Status: DC
Start: 2022-08-02 — End: 2023-02-28

## 2022-08-02 NOTE — Telephone Encounter (Signed)
Patient made aware and would be okay with this change. 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 RX sent.

## 2022-08-02 NOTE — Telephone Encounter (Signed)
Please call patient and let him know that I saw the note that the Virgel Bouquet was quite expensive and I see that he is self-pay.  We could switch him to we exhale which is actually the generic for Advair.  It is similar to [facility] but it is taken twice a day instead of once a day.  If he uses [facility] [employer] and a good Rx coupon it would be about $86.  If that something he is interested in I am more than happy to send to [employer].  Need to go online and get the good Rx coupon though.

## 2022-08-06 ENCOUNTER — Ambulatory Visit: Payer: Self-pay | Admitting: Family Medicine

## 2022-08-12 ENCOUNTER — Ambulatory Visit: Payer: Self-pay | Admitting: Physician Assistant

## 2022-08-28 ENCOUNTER — Telehealth: Payer: Self-pay | Admitting: Physician Assistant

## 2022-08-28 DIAGNOSIS — I10 Essential (primary) hypertension: Secondary | ICD-10-CM

## 2022-08-28 NOTE — Telephone Encounter (Signed)
Patient said will not have insurance for 2 months started new job but wants a refill might be needed for RX Losartan if possible?

## 2022-08-29 MED ORDER — LOSARTAN POTASSIUM 100 MG PO TABS: ORAL_TABLET | ORAL | 0 refills | Status: DC

## 2022-08-29 NOTE — Telephone Encounter (Signed)
Refills have been sent, but still needs appt scheduled in 3 months. Thanks!

## 2022-09-03 ENCOUNTER — Other Ambulatory Visit: Payer: Self-pay | Admitting: Family Medicine

## 2022-09-03 DIAGNOSIS — M1A9XX0 Chronic gout, unspecified, without tophus (tophi): Secondary | ICD-10-CM

## 2022-09-03 DIAGNOSIS — I10 Essential (primary) hypertension: Secondary | ICD-10-CM

## 2022-09-04 ENCOUNTER — Ambulatory Visit: Payer: Self-pay | Admitting: Physician Assistant

## 2022-10-03 ENCOUNTER — Encounter: Payer: Self-pay | Admitting: Sports Medicine

## 2022-10-03 ENCOUNTER — Ambulatory Visit (INDEPENDENT_AMBULATORY_CARE_PROVIDER_SITE_OTHER): Payer: Self-pay | Admitting: Sports Medicine

## 2022-10-03 VITALS — BP 160/90 | HR 82

## 2022-10-03 DIAGNOSIS — F411 Generalized anxiety disorder: Secondary | ICD-10-CM | POA: Insufficient documentation

## 2022-10-03 DIAGNOSIS — F17200 Nicotine dependence, unspecified, uncomplicated: Secondary | ICD-10-CM

## 2022-10-03 DIAGNOSIS — J312 Chronic pharyngitis: Secondary | ICD-10-CM

## 2022-10-03 MED ORDER — OMEPRAZOLE 40 MG PO CPDR: 40.0000 mg | DELAYED_RELEASE_CAPSULE | Freq: Every day | ORAL | 3 refills | Status: DC

## 2022-10-03 MED ORDER — SERTRALINE HCL 50 MG PO TABS: 50.0000 mg | ORAL_TABLET | Freq: Every day | ORAL | 3 refills | Status: DC

## 2022-10-03 NOTE — Assessment & Plan Note (Signed)
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 He continues to have sore throat worse in the morning, he has chronic throat clearing through the day. Little bit of sour brash. He does consume a great deal of alcohol, he is a heavy smoker. 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 He understands we need to do this for 6 weeks with strict follow-up, and if persistence of symptoms he will need repeat direct laryngeal visualization to rule out laryngeal cancer considering his history of smoking.

## 2022-10-03 NOTE — Assessment & Plan Note (Signed)
Michael Meza is a heavy smoker, he typically smokes to control his anxiety, he did not respond well to bupropion, adding Zoloft 50 mg daily, he will follow this up in 6 weeks with me. 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

## 2022-10-03 NOTE — Progress Notes (Signed)
    Procedures performed today:    None.  Independent interpretation of notes and tests performed by another provider:   None.  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 He continues to have sore throat worse in the morning, he has chronic throat clearing through the day. Little bit of sour brash. He does consume a great deal of alcohol, he is a heavy smoker. 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 He understands we need to do this for 6 weeks with strict follow-up, and if persistence of symptoms he will need repeat direct laryngeal visualization to rule out laryngeal cancer considering his history of smoking.  Generalized anxiety disorder Michael Meza is a heavy smoker, he typically smokes to control his anxiety, he did not respond well to bupropion, adding Zoloft 50 mg daily, he will follow this up in 6 weeks with me. 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  Tobacco dependence Smoking cessation discussed. He has tried Chantix and bupropion without significant improvement, he has tried nicotine replacement in the form of patches and gums without improvement. We will try to control his anxiety with Zoloft as he does self medicate with nicotine currently.    ____________________________________________ Gwen Her. Dianah Field, M.D., ABFM., CAQSM., AME. Primary Care and Sports Medicine [facility]  Adjunct Professor of [employer]  Risk manager

## 2022-10-03 NOTE — Assessment & Plan Note (Signed)
Smoking cessation discussed. He has tried Chantix and bupropion without significant improvement, he has tried nicotine replacement in the form of patches and gums without improvement. We will try to control his anxiety with Zoloft as he does self medicate with nicotine currently.

## 2022-10-13 ENCOUNTER — Other Ambulatory Visit: Payer: Self-pay | Admitting: Family Medicine

## 2022-10-13 DIAGNOSIS — M1A9XX0 Chronic gout, unspecified, without tophus (tophi): Secondary | ICD-10-CM

## 2022-10-13 DIAGNOSIS — I10 Essential (primary) hypertension: Secondary | ICD-10-CM

## 2022-10-14 ENCOUNTER — Telehealth: Payer: Self-pay | Admitting: Physician Assistant

## 2022-10-14 DIAGNOSIS — M1A9XX0 Chronic gout, unspecified, without tophus (tophi): Secondary | ICD-10-CM

## 2022-10-14 DIAGNOSIS — I10 Essential (primary) hypertension: Secondary | ICD-10-CM

## 2022-10-14 MED ORDER — METOPROLOL TARTRATE 50 MG PO TABS: ORAL_TABLET | ORAL | 0 refills | Status: DC

## 2022-10-14 MED ORDER — ALLOPURINOL 100 MG PO TABS: ORAL_TABLET | ORAL | 0 refills | Status: DC

## 2022-10-14 NOTE — Telephone Encounter (Signed)
Refill for one month sent to pharmacy.

## 2022-10-14 NOTE — Telephone Encounter (Signed)
The prescriptions need to be sent to [facility] on [address] in [place]

## 2022-10-14 NOTE — Telephone Encounter (Signed)
Patient called he is out of his metoprolol 58m, Allopurrinal 1049msent to

## 2022-10-15 ENCOUNTER — Other Ambulatory Visit: Payer: Self-pay | Admitting: Physician Assistant

## 2022-10-15 DIAGNOSIS — I10 Essential (primary) hypertension: Secondary | ICD-10-CM

## 2022-10-15 DIAGNOSIS — M1A9XX0 Chronic gout, unspecified, without tophus (tophi): Secondary | ICD-10-CM

## 2022-10-15 MED ORDER — ALLOPURINOL 100 MG PO TABS: ORAL_TABLET | ORAL | 0 refills | Status: DC

## 2022-10-15 MED ORDER — METOPROLOL TARTRATE 50 MG PO TABS: ORAL_TABLET | ORAL | 0 refills | Status: DC

## 2022-10-15 NOTE — Telephone Encounter (Signed)
Patient called back and wanted RX sent to [facility]. RX sent.

## 2022-10-15 NOTE — Addendum Note (Signed)
Addended by: Annamaria Helling on: 10/15/2022 09:54 AM   Modules accepted: Orders

## 2022-11-13 ENCOUNTER — Other Ambulatory Visit: Payer: Self-pay | Admitting: Physician Assistant

## 2022-11-13 DIAGNOSIS — I10 Essential (primary) hypertension: Secondary | ICD-10-CM

## 2022-11-13 DIAGNOSIS — M1A9XX0 Chronic gout, unspecified, without tophus (tophi): Secondary | ICD-10-CM

## 2022-11-13 MED ORDER — ALLOPURINOL 100 MG PO TABS: ORAL_TABLET | ORAL | 0 refills | Status: DC

## 2022-11-13 MED ORDER — METOPROLOL TARTRATE 50 MG PO TABS: ORAL_TABLET | ORAL | 0 refills | Status: DC

## 2022-11-13 NOTE — Telephone Encounter (Signed)
Pt has a voicemail that hasn't been set up. Couldn't leave a voicemail. Tvt

## 2022-11-13 NOTE — Telephone Encounter (Signed)
Pt called requesting a refill of allopurinol (ZYLOPRIM) 100 MG tablet [id number]  and metoprolol tartrate (LOPRESSOR) 50 MG tablet [id number] . 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 Pt currently does not have his phone but said it was okay to call [phone] [place]) with an update.

## 2022-11-13 NOTE — Telephone Encounter (Signed)
Patient cancelling his appt 11/15/2022, wants to reschedule until after April 1 when he gets insurance. Please call back to reschedule appt.   RX's last sent 1 month ago for a one month supply. He was last seen in November for a normal follow up with Dr. Huel Cote.  Myldred Raju please advise on refills.

## 2022-11-13 NOTE — Telephone Encounter (Signed)
See previous message - need to call Michael Meza's number.

## 2022-11-14 NOTE — Telephone Encounter (Signed)
Patient scheduled for 12/09/2022 @ 2:20. Tvt

## 2022-11-14 NOTE — Telephone Encounter (Signed)
One month of refills sent. Please call number in message to contact patient about rescheduling appt.

## 2022-11-15 ENCOUNTER — Ambulatory Visit: Payer: Self-pay | Admitting: Physician Assistant

## 2022-12-09 ENCOUNTER — Ambulatory Visit: Payer: Self-pay | Admitting: Physician Assistant

## 2022-12-16 ENCOUNTER — Other Ambulatory Visit: Payer: Self-pay | Admitting: Physician Assistant

## 2022-12-16 DIAGNOSIS — M1A9XX0 Chronic gout, unspecified, without tophus (tophi): Secondary | ICD-10-CM

## 2022-12-16 DIAGNOSIS — I10 Essential (primary) hypertension: Secondary | ICD-10-CM

## 2022-12-27 ENCOUNTER — Other Ambulatory Visit: Payer: Self-pay | Admitting: Physician Assistant

## 2022-12-27 DIAGNOSIS — I10 Essential (primary) hypertension: Secondary | ICD-10-CM

## 2022-12-27 DIAGNOSIS — I700 Atherosclerosis of aorta: Secondary | ICD-10-CM

## 2022-12-29 ENCOUNTER — Other Ambulatory Visit: Payer: Self-pay | Admitting: Family Medicine

## 2022-12-29 ENCOUNTER — Other Ambulatory Visit: Payer: Self-pay | Admitting: Physician Assistant

## 2022-12-29 DIAGNOSIS — I10 Essential (primary) hypertension: Secondary | ICD-10-CM

## 2022-12-29 DIAGNOSIS — I700 Atherosclerosis of aorta: Secondary | ICD-10-CM

## 2023-01-11 ENCOUNTER — Other Ambulatory Visit: Payer: Self-pay | Admitting: Family Medicine

## 2023-01-11 ENCOUNTER — Other Ambulatory Visit: Payer: Self-pay | Admitting: Physician Assistant

## 2023-01-11 DIAGNOSIS — I10 Essential (primary) hypertension: Secondary | ICD-10-CM

## 2023-01-11 DIAGNOSIS — I700 Atherosclerosis of aorta: Secondary | ICD-10-CM

## 2023-01-13 NOTE — Telephone Encounter (Signed)
Patient is scheduled for 02/03/23, thanks.

## 2023-01-19 ENCOUNTER — Other Ambulatory Visit: Payer: Self-pay | Admitting: Family Medicine

## 2023-01-19 DIAGNOSIS — I10 Essential (primary) hypertension: Secondary | ICD-10-CM

## 2023-01-24 ENCOUNTER — Other Ambulatory Visit: Payer: Self-pay | Admitting: Physician Assistant

## 2023-01-24 DIAGNOSIS — M1A9XX0 Chronic gout, unspecified, without tophus (tophi): Secondary | ICD-10-CM

## 2023-01-24 DIAGNOSIS — I10 Essential (primary) hypertension: Secondary | ICD-10-CM

## 2023-01-25 ENCOUNTER — Telehealth: Payer: Self-pay | Admitting: Emergency Medicine

## 2023-01-25 NOTE — Telephone Encounter (Signed)
Call from Michael Meza requesting a refill on his BP medication. Pt has been seen in the past by Dr Wyline Mood & Tandy Gaw, PA-C. Pt states he called the on call number for the family practice and was directed to call Urgent Care. 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 Pt is currently  [place], drives a truck for a living. Pt directed to go to the closest Urgent Care for a refill on his medication. The Urgent Care provider  can prescribe medication for a patient who has not been evaluated in person. Pt verbalized an understanding.

## 2023-01-28 ENCOUNTER — Telehealth: Payer: Self-pay | Admitting: Family Medicine

## 2023-01-28 NOTE — Telephone Encounter (Signed)
Made in error

## 2023-01-29 ENCOUNTER — Other Ambulatory Visit: Payer: Self-pay

## 2023-01-29 DIAGNOSIS — I10 Essential (primary) hypertension: Secondary | ICD-10-CM

## 2023-01-29 MED ORDER — LOSARTAN POTASSIUM 100 MG PO TABS: ORAL_TABLET | ORAL | 0 refills | Status: DC

## 2023-02-03 ENCOUNTER — Ambulatory Visit: Payer: Self-pay | Admitting: Physician Assistant

## 2023-02-17 ENCOUNTER — Other Ambulatory Visit: Payer: Self-pay | Admitting: Physician Assistant

## 2023-02-17 ENCOUNTER — Ambulatory Visit: Payer: Self-pay | Admitting: Physician Assistant

## 2023-02-17 DIAGNOSIS — I10 Essential (primary) hypertension: Secondary | ICD-10-CM

## 2023-02-26 ENCOUNTER — Other Ambulatory Visit: Payer: Self-pay | Admitting: Physician Assistant

## 2023-02-26 DIAGNOSIS — I10 Essential (primary) hypertension: Secondary | ICD-10-CM

## 2023-02-26 DIAGNOSIS — M1A9XX0 Chronic gout, unspecified, without tophus (tophi): Secondary | ICD-10-CM

## 2023-02-26 NOTE — Telephone Encounter (Signed)
Spoke with patient. Scheduled for 9:10 am on 02/28/2023. He states he has enough medication to last until his appointment.

## 2023-02-26 NOTE — Telephone Encounter (Signed)
Patient requesting refill  Metoprolol tartrate 50mg   Last written 01/24/2023 #60 no refills And Allopurinol 100mg   Last written 05/28/204 #60 no refill  Contacted patient and scheduled an upcoming appt for  02/28/23. He states he does have enough medication to last until he is seen on Friday.

## 2023-02-28 ENCOUNTER — Encounter: Payer: Self-pay | Admitting: Physician Assistant

## 2023-02-28 ENCOUNTER — Ambulatory Visit: Payer: Self-pay | Admitting: Physician Assistant

## 2023-02-28 VITALS — BP 116/67 | HR 77 | Ht 71.0 in | Wt 197.0 lb

## 2023-02-28 DIAGNOSIS — Z720 Tobacco use: Secondary | ICD-10-CM

## 2023-02-28 DIAGNOSIS — M1A9XX0 Chronic gout, unspecified, without tophus (tophi): Secondary | ICD-10-CM

## 2023-02-28 DIAGNOSIS — Z13220 Encounter for screening for lipoid disorders: Secondary | ICD-10-CM

## 2023-02-28 DIAGNOSIS — I700 Atherosclerosis of aorta: Secondary | ICD-10-CM

## 2023-02-28 DIAGNOSIS — Z1329 Encounter for screening for other suspected endocrine disorder: Secondary | ICD-10-CM

## 2023-02-28 DIAGNOSIS — I10 Essential (primary) hypertension: Secondary | ICD-10-CM

## 2023-02-28 DIAGNOSIS — J312 Chronic pharyngitis: Secondary | ICD-10-CM

## 2023-02-28 MED ORDER — LOSARTAN POTASSIUM 100 MG PO TABS: 100.0000 mg | ORAL_TABLET | Freq: Every day | ORAL | 1 refills | Status: DC

## 2023-02-28 MED ORDER — ATORVASTATIN CALCIUM 10 MG PO TABS: 10.0000 mg | ORAL_TABLET | Freq: Every day | ORAL | 3 refills | Status: DC

## 2023-02-28 MED ORDER — OMEPRAZOLE 40 MG PO CPDR
40.0000 mg | DELAYED_RELEASE_CAPSULE | Freq: Every day | ORAL | 3 refills | Status: DC
Start: 2023-02-28 — End: 2023-08-05

## 2023-02-28 MED ORDER — ALLOPURINOL 100 MG PO TABS: 100.0000 mg | ORAL_TABLET | Freq: Two times a day (BID) | ORAL | 1 refills | Status: DC

## 2023-02-28 MED ORDER — METOPROLOL TARTRATE 50 MG PO TABS: 50.0000 mg | ORAL_TABLET | Freq: Two times a day (BID) | ORAL | 1 refills | Status: DC

## 2023-02-28 NOTE — Patient Instructions (Signed)
Service options: Cash-only  Doesn't accept reservations  High chairs available Address: [address] [place], [place] [address] Phone: [phone]

## 2023-02-28 NOTE — Progress Notes (Signed)
Established Patient Office Visit  Subjective   Patient ID: Michael Meza, male    DOB: [birth year]  Age: 57 y.o. MRN: [id number]  Chief Complaint  Patient presents with   Hypertension    HPI Pt is a 57 yo male with HTN, HLD, Gout, GERD who presents to the clinic for medication refills.   He is doing ok. He did lose his job and in process of finding another one. No CP, palpitations, headaches, dizziness. Anxiety and mood are good. He continues to smoke 2 packs a day. No SOB. He does have chronic cough.   No gout flares. No worsening GERD symptoms.   He is very compliant with medications.   .. 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   Left testicular pain 12/02/2019   Status post hernia repair 03/10/2020   Alcohol use 03/13/2020   Encounter for screening for lung cancer 03/29/2020   Aortic atherosclerosis (HCC) 05/25/2020   Emphysema of lung (HCC) 05/25/2020   Abnormal liver enzymes 08/15/2021   Combined forms of age-related cataract of left eye 10/12/2015   Left inguinal hernia 12/16/2019   Pseudophakia of right eye 07/07/2013   Nuclear sclerotic cataract of left eye 07/07/2013   Senile nuclear sclerosis 06/30/2013   Chronic sore throat 10/03/2022   Generalized anxiety disorder 10/03/2022   Resolved Ambulatory Problems    Diagnosis Date Noted   Acute gout 03/07/2014   Pain in the chest 03/07/2014   Right foot pain 06/14/2014   Anxiety 08/31/2014   Past Medical History:  Diagnosis Date    Arthritis    Cataract    COPD (chronic obstructive pulmonary disease) (HCC)      ROS See HPI.    Objective:     BP 116/67   Pulse 77   Ht 5\' 11"  (1.803 m)   Wt 197 lb (89.4 kg)   SpO2 95%   BMI 27.48 kg/m  BP Readings from Last 3 Encounters:  02/28/23 116/67  10/03/22 (!) 160/90  07/30/22 138/76   Wt Readings from Last 3 Encounters:  02/28/23 197 lb (89.4 kg)  07/30/22 190 lb 5 oz (86.3 kg)  08/14/21 192 lb (87.1 kg)    ..    02/28/2023   10:17 AM 08/15/2021    6:30 AM 03/10/2020   10:11 AM 05/18/2018    2:21 PM  Depression screen PHQ 2/9  Decreased Interest 0 0 0 1  Down, Depressed, Hopeless 0 0 0 0  PHQ - 2 Score 0 0 0 1  Altered sleeping 0  0 1  Tired, decreased energy 0  0 1  Change in appetite 0  0 0  Feeling bad or failure about yourself  0  0 1  Trouble concentrating 0  0 0  Moving slowly or fidgety/restless 0  0 0  Suicidal thoughts 0  0 0  PHQ-9 Score 0  0 4  Difficult doing work/chores Not difficult at all  Not difficult at all Somewhat difficult   ..    02/28/2023  10:17 AM 03/10/2020   10:11 AM 05/18/2018    2:21 PM  GAD 7 : Generalized Anxiety Score  Nervous, Anxious, on Edge 0 0 1  Control/stop worrying 0 0 1  Worry too much - different things 0 0 1  Trouble relaxing 0 0 0  Restless 0 0 0  Easily annoyed or irritable 0 0 0  Afraid - awful might happen 0 0 0  Total GAD 7 Score 0 0 3  Anxiety Difficulty Not difficult at all Not difficult at all Somewhat difficult      Physical Exam Constitutional:      Appearance: Normal appearance.  HENT:     Head: Normocephalic.  Cardiovascular:     Rate and Rhythm: Normal rate and regular rhythm.     Pulses: Normal pulses.  Pulmonary:     Effort: Pulmonary effort is normal.     Breath sounds: Normal breath sounds.  Neurological:     General: No focal deficit present.     Mental Status: He is alert and oriented to person, place, and time.  Psychiatric:        Mood and Affect: Mood normal.        The 10-year ASCVD risk score (Arnett DK, et al., 2019) is: 6.3%    Assessment & Plan:  Marland KitchenMarland KitchenDimari was seen today for hypertension.  Diagnoses and all orders for this visit:  Essential hypertension, benign -     Lipid Panel w/reflex Direct LDL -     COMPLETE METABOLIC PANEL WITH GFR -     Uric acid -     losartan (COZAAR) 100 MG tablet; Take 1 tablet (100 mg total) by mouth daily. -     metoprolol tartrate (LOPRESSOR) 50 MG tablet; Take 1 tablet (50 mg total) by mouth 2 (two) times daily.  Chronic gout without tophus, unspecified cause, unspecified site -     Lipid Panel w/reflex Direct LDL -     COMPLETE METABOLIC PANEL WITH GFR -     Uric acid -     allopurinol (ZYLOPRIM) 100 MG tablet; Take 1 tablet (100 mg total) by mouth 2 (two) times daily.  Lipid screening -     Lipid Panel w/reflex Direct LDL -     COMPLETE METABOLIC PANEL WITH GFR -     Uric acid  Aortic atherosclerosis (HCC) -     atorvastatin (LIPITOR) 10 MG tablet; Take 1 tablet (10 mg total) by mouth daily.  Chronic sore throat -     omeprazole (PRILOSEC) 40 MG capsule; Take 1 capsule (40 mg total) by mouth daily with supper.  Thyroid disorder screen -     TSH  Tobacco abuse   Vitals look great PHQ/GAD look wonderful Pt does not have insurance right now and will hold on labs Refilled medication Continues to smoke 2 packs a day Pt declines any smoking cessation counseling Follow up in 6 months   Return in about 6 months (around 08/30/2023).    Tandy Gaw, PA-C

## 2023-04-05 ENCOUNTER — Other Ambulatory Visit: Payer: Self-pay | Admitting: Family Medicine

## 2023-05-05 ENCOUNTER — Other Ambulatory Visit: Payer: Self-pay | Admitting: Physician Assistant

## 2023-06-12 ENCOUNTER — Other Ambulatory Visit: Payer: Self-pay | Admitting: Physician Assistant

## 2023-06-12 DIAGNOSIS — J432 Centrilobular emphysema: Secondary | ICD-10-CM

## 2023-06-15 ENCOUNTER — Other Ambulatory Visit: Payer: Self-pay | Admitting: Physician Assistant

## 2023-06-15 DIAGNOSIS — J432 Centrilobular emphysema: Secondary | ICD-10-CM

## 2023-06-16 NOTE — Telephone Encounter (Signed)
Patient called he is following up on his prescriptions of  Advair 250-50 MCG Omeprazole 40mg  Please submit to [facility]  [place] [address] Phone [phone]

## 2023-08-02 ENCOUNTER — Other Ambulatory Visit: Payer: Self-pay | Admitting: Physician Assistant

## 2023-08-02 DIAGNOSIS — J312 Chronic pharyngitis: Secondary | ICD-10-CM

## 2023-08-23 ENCOUNTER — Other Ambulatory Visit: Payer: Self-pay | Admitting: Physician Assistant

## 2023-08-23 DIAGNOSIS — I10 Essential (primary) hypertension: Secondary | ICD-10-CM

## 2023-09-01 ENCOUNTER — Ambulatory Visit (INDEPENDENT_AMBULATORY_CARE_PROVIDER_SITE_OTHER): Payer: 59 | Admitting: Physician Assistant

## 2023-09-01 ENCOUNTER — Encounter: Payer: Self-pay | Admitting: Physician Assistant

## 2023-09-01 VITALS — BP 130/87 | HR 74 | Ht 71.0 in | Wt 187.0 lb

## 2023-09-01 DIAGNOSIS — K2100 Gastro-esophageal reflux disease with esophagitis, without bleeding: Secondary | ICD-10-CM | POA: Diagnosis not present

## 2023-09-01 DIAGNOSIS — J312 Chronic pharyngitis: Secondary | ICD-10-CM

## 2023-09-01 DIAGNOSIS — I700 Atherosclerosis of aorta: Secondary | ICD-10-CM

## 2023-09-01 DIAGNOSIS — I10 Essential (primary) hypertension: Secondary | ICD-10-CM

## 2023-09-01 DIAGNOSIS — M1A9XX0 Chronic gout, unspecified, without tophus (tophi): Secondary | ICD-10-CM

## 2023-09-01 DIAGNOSIS — F17210 Nicotine dependence, cigarettes, uncomplicated: Secondary | ICD-10-CM

## 2023-09-01 DIAGNOSIS — J432 Centrilobular emphysema: Secondary | ICD-10-CM

## 2023-09-01 DIAGNOSIS — L723 Sebaceous cyst: Secondary | ICD-10-CM

## 2023-09-01 DIAGNOSIS — Z720 Tobacco use: Secondary | ICD-10-CM

## 2023-09-01 DIAGNOSIS — Z79899 Other long term (current) drug therapy: Secondary | ICD-10-CM

## 2023-09-01 MED ORDER — LOSARTAN POTASSIUM 100 MG PO TABS: 100.0000 mg | ORAL_TABLET | Freq: Every day | ORAL | 1 refills | Status: DC

## 2023-09-01 MED ORDER — OMEPRAZOLE 40 MG PO CPDR: 40.0000 mg | DELAYED_RELEASE_CAPSULE | Freq: Every day | ORAL | 3 refills | Status: DC

## 2023-09-01 MED ORDER — METOPROLOL TARTRATE 50 MG PO TABS: 50.0000 mg | ORAL_TABLET | Freq: Two times a day (BID) | ORAL | 1 refills | Status: DC

## 2023-09-01 MED ORDER — ALLOPURINOL 100 MG PO TABS: 100.0000 mg | ORAL_TABLET | Freq: Two times a day (BID) | ORAL | 1 refills | Status: DC

## 2023-09-01 NOTE — Patient Instructions (Signed)
Get labs.  Refilled medication.

## 2023-09-02 ENCOUNTER — Encounter: Payer: Self-pay | Admitting: Physician Assistant

## 2023-09-02 DIAGNOSIS — L723 Sebaceous cyst: Secondary | ICD-10-CM | POA: Insufficient documentation

## 2023-09-16 ENCOUNTER — Ambulatory Visit (INDEPENDENT_AMBULATORY_CARE_PROVIDER_SITE_OTHER): Payer: 59 | Admitting: Physician Assistant

## 2023-09-16 ENCOUNTER — Encounter: Payer: Self-pay | Admitting: Physician Assistant

## 2023-09-16 VITALS — BP 152/82 | HR 88 | Temp 97.5°F | Ht 71.0 in | Wt 197.0 lb

## 2023-09-16 DIAGNOSIS — J312 Chronic pharyngitis: Secondary | ICD-10-CM | POA: Diagnosis not present

## 2023-09-16 DIAGNOSIS — J0140 Acute pansinusitis, unspecified: Secondary | ICD-10-CM | POA: Diagnosis not present

## 2023-09-16 DIAGNOSIS — M1A9XX0 Chronic gout, unspecified, without tophus (tophi): Secondary | ICD-10-CM

## 2023-09-16 DIAGNOSIS — I10 Essential (primary) hypertension: Secondary | ICD-10-CM | POA: Diagnosis not present

## 2023-09-16 DIAGNOSIS — I700 Atherosclerosis of aorta: Secondary | ICD-10-CM

## 2023-09-16 DIAGNOSIS — R6889 Other general symptoms and signs: Secondary | ICD-10-CM | POA: Diagnosis not present

## 2023-09-16 DIAGNOSIS — J432 Centrilobular emphysema: Secondary | ICD-10-CM | POA: Diagnosis not present

## 2023-09-16 DIAGNOSIS — Z79899 Other long term (current) drug therapy: Secondary | ICD-10-CM | POA: Diagnosis not present

## 2023-09-16 LAB — POCT INFLUENZA A/B
Influenza A, POC: NEGATIVE
Influenza B, POC: NEGATIVE

## 2023-09-16 MED ORDER — OMEPRAZOLE 40 MG PO CPDR: 40.0000 mg | DELAYED_RELEASE_CAPSULE | Freq: Every day | ORAL | 3 refills | Status: DC

## 2023-09-16 MED ORDER — FLUTICASONE-SALMETEROL 250-50 MCG/ACT IN AEPB: INHALATION_SPRAY | RESPIRATORY_TRACT | 5 refills | Status: DC

## 2023-09-16 MED ORDER — LOSARTAN POTASSIUM 100 MG PO TABS: 100.0000 mg | ORAL_TABLET | Freq: Every day | ORAL | 1 refills | Status: DC

## 2023-09-16 MED ORDER — AMOXICILLIN-POT CLAVULANATE 875-125 MG PO TABS: 1.0000 | ORAL_TABLET | Freq: Two times a day (BID) | ORAL | 0 refills | Status: DC

## 2023-09-16 MED ORDER — ALLOPURINOL 100 MG PO TABS: 100.0000 mg | ORAL_TABLET | Freq: Two times a day (BID) | ORAL | 1 refills | Status: DC

## 2023-09-16 MED ORDER — METOPROLOL TARTRATE 50 MG PO TABS: 50.0000 mg | ORAL_TABLET | Freq: Two times a day (BID) | ORAL | 1 refills | Status: DC

## 2023-09-16 MED ORDER — ATORVASTATIN CALCIUM 10 MG PO TABS: 10.0000 mg | ORAL_TABLET | Freq: Every day | ORAL | 3 refills | Status: DC

## 2023-09-16 NOTE — Progress Notes (Deleted)
   Established Patient Office Visit  Subjective   Patient ID: Michael Meza, male    DOB: [birth year]  Age: 58 y.o. MRN: [id number]  No chief complaint on file.   HPI  {History (Optional):23778}  ROS    Objective:     There were no vitals taken for this visit. {Vitals History (Optional):23777}  Physical Exam   No results found for any visits on 09/16/23.  {Labs (Optional):23779}  The 10-year ASCVD risk score (Arnett DK, et al., 2019) is: 7.6%    Assessment & Plan:   Problem List Items Addressed This Visit   None   No follow-ups on file.    Vernell LABOR Lindzy Rupert, Student-PA

## 2023-09-16 NOTE — Patient Instructions (Signed)

## 2023-09-16 NOTE — Progress Notes (Signed)
 Acute Office Visit  Subjective:     Patient ID: Michael Meza, male    DOB: 16-Dec-1965, 58 y.o.   MRN: [id number]  Chief Complaint  Patient presents with   Cough    Flu like symptoms    HPI Patient is in today for flu like sxs for the last five days, but he is feeling better today. Pt works as a naval architect and at [employer] and claims that some coworkers around him have been sick. He states that over the weekend he was experiencing sinus congestion and clear rhinorrhea. He states that he has felt a little weak and had the chills but does not know if he had a fever because he did not have a thermometer to check. He denies coughing, shortness of breath or dyspnea.  Pt is a chronic smoker.  .. 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   Left testicular pain 12/02/2019   Status post hernia repair 03/10/2020   Alcohol use 03/13/2020   Encounter for screening for lung cancer 03/29/2020   Aortic atherosclerosis (HCC) 05/25/2020   Emphysema of lung (HCC) 05/25/2020   Abnormal liver enzymes 08/15/2021   Combined forms of age-related cataract of left eye 10/12/2015   Left inguinal hernia 12/16/2019   Pseudophakia of right eye 07/07/2013   Nuclear sclerotic cataract of left eye 07/07/2013   Senile nuclear sclerosis 06/30/2013   Chronic sore throat 10/03/2022   Generalized anxiety disorder 10/03/2022   Sebaceous cyst 09/02/2023   Resolved Ambulatory Problems    Diagnosis Date Noted   Acute gout 03/07/2014    Pain in the chest 03/07/2014   Right foot pain 06/14/2014   Anxiety 08/31/2014   Past Medical History:  Diagnosis Date   Arthritis    Cataract    COPD (chronic obstructive pulmonary disease) (HCC)      Review of Systems  Constitutional:  Positive for chills and malaise/fatigue.  HENT:  Positive for congestion. Negative for ear pain and sore throat.   Respiratory:  Negative for cough, shortness of breath and wheezing.         Objective:    BP (!) 152/82   Pulse 88   Temp (!) 97.5 F (36.4 C) (Oral)   Ht 5' 11 (1.803 m)   Wt 197 lb (89.4 kg)   SpO2 99%   BMI 27.48 kg/m  BP Readings from Last 3 Encounters:  09/16/23 (!) 152/82  09/01/23 130/87  02/28/23 116/67   Wt Readings from Last 3 Encounters:  09/16/23 197 lb (89.4 kg)  09/01/23 187 lb (84.8 kg)  02/28/23 197 lb (89.4 kg)      Physical Exam Constitutional:      Appearance: Normal appearance.  HENT:     Head: Normocephalic.     Right Ear: Tympanic membrane normal.     Left Ear: Tympanic membrane normal.     Nose: Rhinorrhea present.     Mouth/Throat:     Pharynx: Oropharynx is clear.  Cardiovascular:     Rate and Rhythm:  Normal rate and regular rhythm.  Pulmonary:     Effort: Pulmonary effort is normal.     Breath sounds: Normal breath sounds.  Musculoskeletal:     Cervical back: No tenderness.  Neurological:     Mental Status: He is alert.     Results for orders placed or performed in visit on 09/16/23  POCT Influenza A/B  Result Value Ref Range   Influenza A, POC Negative Negative   Influenza B, POC Negative Negative        Assessment & Plan:   Nickalous was seen today for cough.  Diagnoses and all orders for this visit:  Flu-like symptoms -     POCT Influenza A/B  Essential hypertension, benign -     losartan  (COZAAR ) 100 MG tablet; Take 1 tablet (100 mg total) by mouth daily. -     metoprolol  tartrate (LOPRESSOR ) 50 MG tablet; Take 1 tablet (50 mg total) by mouth 2 (two)  times daily.  Chronic sore throat -     omeprazole  (PRILOSEC) 40 MG capsule; Take 1 capsule (40 mg total) by mouth daily.  Chronic gout without tophus, unspecified cause, unspecified site -     allopurinol  (ZYLOPRIM ) 100 MG tablet; Take 1 tablet (100 mg total) by mouth 2 (two) times daily.  Aortic atherosclerosis (HCC) -     atorvastatin  (LIPITOR) 10 MG tablet; Take 1 tablet (10 mg total) by mouth daily.  Acute non-recurrent pansinusitis -     amoxicillin -clavulanate (AUGMENTIN ) 875-125 MG tablet; Take 1 tablet by mouth 2 (two) times daily.  Centrilobular emphysema (HCC) -     fluticasone -salmeterol (ADVAIR) 250-50 MCG/ACT AEPB; INHALE 1 DOSE BY MOUTH IN THE MORNING AND 1 AT BEDTIME  Other orders -     Discontinue: amoxicillin -clavulanate (AUGMENTIN ) 875-125 MG tablet; Take 1 tablet by mouth 2 (two) times daily. -     Discontinue: fluticasone -salmeterol (ADVAIR) 250-50 MCG/ACT AEPB; INHALE 1 DOSE BY MOUTH IN THE MORNING AND 1 AT BEDTIME   Flu like sxs for the last 5 days Negative flu test Start Augmentin  for sinusitis Follow up if sxs do not resolve after finishing course of antibiotics Resent medications to new pharmacy due to insurance change   Vermell Bologna, PA-C

## 2023-09-17 LAB — LIPID PANEL
Chol/HDL Ratio: 1.6 {ratio} (ref 0.0–5.0)
Cholesterol, Total: 136 mg/dL (ref 100–199)
HDL: 85 mg/dL (ref >39)
LDL Chol Calc (NIH): 39 mg/dL (ref 0–99)
Triglycerides: 55 mg/dL (ref 0–149)
VLDL Cholesterol Cal: 12 mg/dL (ref 5–40)

## 2023-09-17 LAB — CMP14+EGFR
ALT: 24 [IU]/L (ref 0–44)
AST: 37 [IU]/L (ref 0–40)
Albumin: 4.6 g/dL (ref 3.8–4.9)
Alkaline Phosphatase: 115 [IU]/L (ref 44–121)
BUN/Creatinine Ratio: 13 (ref 9–20)
BUN: 9 mg/dL (ref 6–24)
Bilirubin Total: 0.8 mg/dL (ref 0.0–1.2)
CO2: 22 mmol/L (ref 20–29)
Calcium: 9.3 mg/dL (ref 8.7–10.2)
Chloride: 99 mmol/L (ref 96–106)
Creatinine, Ser: 0.72 mg/dL — ABNORMAL LOW (ref 0.76–1.27)
Globulin, Total: 2.5 g/dL (ref 1.5–4.5)
Glucose: 103 mg/dL — ABNORMAL HIGH (ref 70–99)
Potassium: 5.1 mmol/L (ref 3.5–5.2)
Sodium: 135 mmol/L (ref 134–144)
Total Protein: 7.1 g/dL (ref 6.0–8.5)
eGFR: 106 mL/min/{1.73_m2} (ref >59)

## 2023-09-17 LAB — URIC ACID: Uric Acid: 3.9 mg/dL (ref 3.8–8.4)

## 2023-10-06 ENCOUNTER — Ambulatory Visit: Payer: Self-pay | Admitting: Physician Assistant

## 2023-10-06 NOTE — Telephone Encounter (Signed)
  Chief Complaint: Fall Symptoms: Pain with coughing Frequency: Since fall on 01/31 Pertinent Negatives: Patient denies SOB, CP Disposition: [] ED /[] Urgent Care (no appt availability in office) / [x] Appointment(In office/virtual)/ []  [facility]/ [] Home Care/ [] Refused Recommended Disposition /[] [facility]/ []  Follow-up with PCP Additional Notes: Pt reports he tripped overnight going to the BR and fell on his left side. Pt denies SOB, CP. Reports pain of 7/10 with coughing or certain movement. OV scheduled for tomorrow AM. This RN educated pt on home care, new-worsening symptoms, when to call back/seek emergent care. Pt verbalized understanding and agrees to plan.    Copied from CRM [id number]. Topic: Clinical - Red Word Triage >> Oct 06, 2023 11:44 AM Gildardo Pounds wrote: Red Word that prompted transfer to Nurse Triage: patient fell a couple night ago, ribs hurting, painful when getting up and down. discomfort, no pain [phone] Reason for Disposition  MILD weakness (i.e., does not interfere with ability to work, go to school, normal activities)  (Exception: Mild weakness is a chronic symptom.)  Answer Assessment - Initial Assessment Questions 1. MECHANISM: "How did the fall happen?"     Tripped and fell in the middle of the night a couple nights ago getting up to use BR  3. ONSET: "When did the fall happen?" (e.g., minutes, hours, or days ago)     2 nights ago 01/31 4. LOCATION: "What part of the body hit the ground?" (e.g., back, buttocks, head, hips, knees, hands, head, stomach)     Left side/ribs 5. INJURY: "Did you hurt (injure) yourself when you fell?" If Yes, ask: "What did you injure? Tell me more about this?" (e.g., body area; type of injury; pain severity)"     Hit ribs first, carpet burn on left side 6. PAIN: "Is there any pain?" If Yes, ask: "How bad is the pain?" (e.g., Scale 1-10; or mild,  moderate, severe)   - NONE (0): No pain   - MILD (1-3): Doesn't  interfere with normal activities    - MODERATE (4-7): Interferes with normal activities or awakens from sleep    - SEVERE (8-10): Excruciating pain, unable to do any normal activities      7/10 9. OTHER SYMPTOMS: "Do you have any other symptoms?" (e.g., dizziness, fever, weakness; new onset or worsening).      None 10. CAUSE: "What do you think caused the fall (or falling)?" (e.g., tripped, dizzy spell)       Tripped  Protocols used: Falls and [facility]

## 2023-10-07 ENCOUNTER — Encounter: Payer: Self-pay | Admitting: Physician Assistant

## 2023-10-07 ENCOUNTER — Other Ambulatory Visit: Payer: Self-pay | Admitting: Physician Assistant

## 2023-10-07 ENCOUNTER — Ambulatory Visit (INDEPENDENT_AMBULATORY_CARE_PROVIDER_SITE_OTHER): Payer: 59

## 2023-10-07 ENCOUNTER — Ambulatory Visit (INDEPENDENT_AMBULATORY_CARE_PROVIDER_SITE_OTHER): Payer: 59 | Admitting: Physician Assistant

## 2023-10-07 VITALS — BP 153/77 | HR 80 | Ht 71.0 in | Wt 180.0 lb

## 2023-10-07 DIAGNOSIS — W19XXXA Unspecified fall, initial encounter: Secondary | ICD-10-CM

## 2023-10-07 DIAGNOSIS — S299XXA Unspecified injury of thorax, initial encounter: Secondary | ICD-10-CM

## 2023-10-07 DIAGNOSIS — R0781 Pleurodynia: Secondary | ICD-10-CM

## 2023-10-07 DIAGNOSIS — S2232XA Fracture of one rib, left side, initial encounter for closed fracture: Secondary | ICD-10-CM

## 2023-10-07 DIAGNOSIS — J181 Lobar pneumonia, unspecified organism: Secondary | ICD-10-CM

## 2023-10-07 MED ORDER — AZITHROMYCIN 250 MG PO TABS: ORAL_TABLET | ORAL | 0 refills | Status: DC

## 2023-10-07 MED ORDER — KETOROLAC TROMETHAMINE 60 MG/2ML IM SOLN
60.0000 mg | Freq: Once | INTRAMUSCULAR | Status: AC
  Administered 2023-10-07: 60 mg via INTRAMUSCULAR

## 2023-10-07 MED ORDER — TRAMADOL HCL 50 MG PO TABS: 50.0000 mg | ORAL_TABLET | Freq: Four times a day (QID) | ORAL | 0 refills | Status: AC | PRN

## 2023-10-07 NOTE — Progress Notes (Signed)
 You do have fracture of the left 7th rib. Pain control with heat and Tramadol . Do not lift more than 20lbs.   You do have some consolidation of the right lung base. I want you to start antibiotic(zpak for 5 days) to get rid of any infection and we need to get CT of lungs to better evaluate.

## 2023-10-07 NOTE — Patient Instructions (Signed)
Tramadol for pain as needed every 6-8 hours Warm compresses Get CXR today

## 2023-10-07 NOTE — Progress Notes (Signed)
 Acute Office Visit  Subjective:     Patient ID: Michael Meza, male    DOB: [birth year], 58 y.o.   MRN: [id number]  Chief Complaint  Patient presents with   Chest Pain    HPI Patient is in today after a fall 2 nights ago in the middle of the night where he feel on his left flank over a transmitter in the hallway. He has been in a lot of pain since. His neighbor gave him a tramadol  and helped a lot. No trouble breathing. Has a chronic cough. Continues to smoke. Needs a note for work.   .. 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   Left testicular pain 12/02/2019   Status post hernia repair 03/10/2020   Alcohol use 03/13/2020   Encounter for screening for lung cancer 03/29/2020   Aortic atherosclerosis (HCC) 05/25/2020   Emphysema of lung (HCC) 05/25/2020   Abnormal liver enzymes 08/15/2021   Combined forms of age-related cataract of left eye 10/12/2015   Left inguinal hernia 12/16/2019   Pseudophakia of right eye 07/07/2013   Nuclear sclerotic cataract of left eye 07/07/2013   Senile nuclear sclerosis 06/30/2013   Chronic sore throat 10/03/2022   Generalized anxiety disorder 10/03/2022   Sebaceous cyst 09/02/2023   Resolved Ambulatory Problems    Diagnosis Date Noted   Acute gout 03/07/2014   Pain in the chest 03/07/2014   Right foot pain 06/14/2014   Anxiety 08/31/2014   Past Medical History:  Diagnosis Date   Arthritis    Cataract    COPD (chronic obstructive pulmonary disease) (HCC)       ROS See HPI.     Objective:    BP (!) 153/77   Pulse 80   Ht 5' 11 (1.803 m)   Wt 180 lb (81.6 kg)   BMI 25.10 kg/m  BP Readings from Last 3 Encounters:  10/07/23 (!) 153/77  09/16/23 (!) 152/82  09/01/23 130/87   Wt Readings from Last 3 Encounters:  10/07/23 180 lb (81.6 kg)  09/16/23 197 lb (89.4 kg)  09/01/23 187 lb (84.8 kg)      Physical Exam Constitutional:      Appearance: He is well-developed.  HENT:     Head: Normocephalic.  Cardiovascular:     Rate and Rhythm: Normal rate and regular rhythm.  Pulmonary:     Effort: Pulmonary effort is normal.  Chest:     Comments: Tenderness over lower left flank and ribs No obvious step off, redness, bruising Neurological:     Mental Status: He is alert and oriented to person, place, and time.  Psychiatric:        Mood and Affect: Mood normal.          Assessment & Plan:  Michael Meza was seen today for chest pain.  Diagnoses and all orders for this visit:  Rib pain on left side -     DG Ribs Unilateral W/Chest Left; Future -     traMADol  (ULTRAM ) 50 MG tablet; Take 1 tablet (  50 mg total) by mouth every 6 (six) hours as needed for up to 5 days. -     ketorolac  (TORADOL ) injection 60 mg  Traumatic injury of rib -     DG Ribs Unilateral W/Chest Left; Future -     traMADol  (ULTRAM ) 50 MG tablet; Take 1 tablet (50 mg total) by mouth every 6 (six) hours as needed for up to 5 days. -     ketorolac  (TORADOL ) injection 60 mg  Fall, initial encounter -     DG Ribs Unilateral W/Chest Left; Future -     traMADol  (ULTRAM ) 50 MG tablet; Take 1 tablet (50 mg total) by mouth every 6 (six) hours as needed for up to 5 days. -     ketorolac  (TORADOL ) injection 60 mg   Xray confirms fracture of left 7th rib Discussed pain control and good deep breathing Toradol  given today for pain and tramadol  as needed  HO given Written out of work through Wednesday and then with restriction of no more than 20lbs for 4  weeks  Xray does show some consolidation over the right lower lung Zpak sent to start and CT of chest ordered to further evaluate and r/o mass Pt is current smoker  Vermell Bologna, PA-C

## 2023-10-08 ENCOUNTER — Telehealth: Payer: Self-pay

## 2023-10-08 NOTE — Telephone Encounter (Signed)
 Copied from CRM [id number]. Topic: Clinical - Request for Lab/Test Order >> Oct 07, 2023  4:58 PM Bridgette Campus T wrote: Reason for CRM: returning call from office and need to have CT scheduled per message left- please call patient [phone]

## 2023-10-09 NOTE — Telephone Encounter (Signed)
 Patient seen in office 10/07/23 by Sandy Crumb, pa

## 2023-10-31 ENCOUNTER — Other Ambulatory Visit: Payer: 59

## 2023-12-08 ENCOUNTER — Ambulatory Visit (INDEPENDENT_AMBULATORY_CARE_PROVIDER_SITE_OTHER)

## 2023-12-08 DIAGNOSIS — J9811 Atelectasis: Secondary | ICD-10-CM | POA: Diagnosis not present

## 2023-12-08 DIAGNOSIS — J90 Pleural effusion, not elsewhere classified: Secondary | ICD-10-CM | POA: Diagnosis not present

## 2023-12-08 DIAGNOSIS — R918 Other nonspecific abnormal finding of lung field: Secondary | ICD-10-CM | POA: Diagnosis not present

## 2023-12-08 DIAGNOSIS — J181 Lobar pneumonia, unspecified organism: Secondary | ICD-10-CM

## 2023-12-08 MED ORDER — IOHEXOL 300 MG/ML  SOLN
100.0000 mL | Freq: Once | INTRAMUSCULAR | Status: AC | PRN
  Administered 2023-12-08: 75 mL via INTRAVENOUS

## 2023-12-29 ENCOUNTER — Encounter: Payer: Self-pay | Admitting: Physician Assistant

## 2023-12-29 ENCOUNTER — Other Ambulatory Visit: Payer: Self-pay | Admitting: Physician Assistant

## 2023-12-29 DIAGNOSIS — J90 Pleural effusion, not elsewhere classified: Secondary | ICD-10-CM

## 2023-12-29 DIAGNOSIS — J432 Centrilobular emphysema: Secondary | ICD-10-CM

## 2023-12-29 DIAGNOSIS — F17200 Nicotine dependence, unspecified, uncomplicated: Secondary | ICD-10-CM

## 2023-12-29 NOTE — Progress Notes (Signed)
 Kaamil,   Your CT scan continues to show emphysema and right lung fluid that raises concern for  underlying mass. We need to refer you to pulmonology to consider further testing.

## 2023-12-30 ENCOUNTER — Telehealth: Payer: Self-pay

## 2023-12-30 NOTE — Telephone Encounter (Signed)
Attempted call to patient . Phone rang without answer. Could not leave a voice mail message.

## 2023-12-30 NOTE — Telephone Encounter (Signed)
 Copied from CRM [id number]. Topic: Clinical - Medical Advice >> Dec 29, 2023  3:18 PM Shelby Dessert H wrote: Reason for CRM: Patient called and is wanting someone to call him back, I informed the patient that someone was calling about his results and he wanted to speak with someone because he had some questions patients call back number is [phone].

## 2023-12-30 NOTE — Telephone Encounter (Signed)
 Patient informed of results.

## 2024-03-01 ENCOUNTER — Ambulatory Visit: Payer: 59 | Admitting: Physician Assistant

## 2024-03-08 ENCOUNTER — Ambulatory Visit: Admitting: Physician Assistant

## 2024-03-15 ENCOUNTER — Ambulatory Visit: Admitting: Physician Assistant

## 2024-03-30 ENCOUNTER — Other Ambulatory Visit: Payer: Self-pay | Admitting: Physician Assistant

## 2024-03-30 DIAGNOSIS — M1A9XX0 Chronic gout, unspecified, without tophus (tophi): Secondary | ICD-10-CM

## 2024-03-30 DIAGNOSIS — I700 Atherosclerosis of aorta: Secondary | ICD-10-CM

## 2024-04-05 ENCOUNTER — Ambulatory Visit: Admitting: Physician Assistant

## 2024-04-05 DIAGNOSIS — I10 Essential (primary) hypertension: Secondary | ICD-10-CM

## 2024-04-15 ENCOUNTER — Other Ambulatory Visit: Payer: Self-pay | Admitting: Physician Assistant

## 2024-04-15 DIAGNOSIS — I700 Atherosclerosis of aorta: Secondary | ICD-10-CM

## 2024-04-15 DIAGNOSIS — M1A9XX0 Chronic gout, unspecified, without tophus (tophi): Secondary | ICD-10-CM

## 2024-04-27 ENCOUNTER — Other Ambulatory Visit: Payer: Self-pay | Admitting: Physician Assistant

## 2024-04-27 DIAGNOSIS — I10 Essential (primary) hypertension: Secondary | ICD-10-CM

## 2024-05-04 ENCOUNTER — Encounter: Payer: Self-pay | Admitting: Sports Medicine

## 2024-05-13 ENCOUNTER — Other Ambulatory Visit: Payer: Self-pay | Admitting: Physician Assistant

## 2024-05-13 DIAGNOSIS — I700 Atherosclerosis of aorta: Secondary | ICD-10-CM

## 2024-05-14 ENCOUNTER — Telehealth: Payer: Self-pay

## 2024-05-14 NOTE — Telephone Encounter (Signed)
 Copied from CRM [id number]. Topic: Clinical - Medication Question >> May 14, 2024  8:37 AM Farrel B wrote: Reason for CRM: Patient states that he hasn't been able to use his inhaler because its so expensive but would like to know if its something cheaper he can be prescribed or anything he ca use at this time

## 2024-05-27 ENCOUNTER — Other Ambulatory Visit: Payer: Self-pay | Admitting: Physician Assistant

## 2024-05-27 DIAGNOSIS — I700 Atherosclerosis of aorta: Secondary | ICD-10-CM

## 2024-06-07 ENCOUNTER — Ambulatory Visit: Admitting: Physician Assistant

## 2024-06-07 ENCOUNTER — Encounter: Payer: Self-pay | Admitting: Physician Assistant

## 2024-06-07 VITALS — BP 140/90 | HR 61 | Ht 71.0 in | Wt 176.0 lb

## 2024-06-07 DIAGNOSIS — M1A9XX0 Chronic gout, unspecified, without tophus (tophi): Secondary | ICD-10-CM

## 2024-06-07 DIAGNOSIS — Z79899 Other long term (current) drug therapy: Secondary | ICD-10-CM | POA: Diagnosis not present

## 2024-06-07 DIAGNOSIS — J439 Emphysema, unspecified: Secondary | ICD-10-CM | POA: Diagnosis not present

## 2024-06-07 DIAGNOSIS — R918 Other nonspecific abnormal finding of lung field: Secondary | ICD-10-CM | POA: Diagnosis not present

## 2024-06-07 DIAGNOSIS — F1010 Alcohol abuse, uncomplicated: Secondary | ICD-10-CM

## 2024-06-07 DIAGNOSIS — F411 Generalized anxiety disorder: Secondary | ICD-10-CM

## 2024-06-07 DIAGNOSIS — F17200 Nicotine dependence, unspecified, uncomplicated: Secondary | ICD-10-CM | POA: Diagnosis not present

## 2024-06-07 DIAGNOSIS — I10 Essential (primary) hypertension: Secondary | ICD-10-CM | POA: Diagnosis not present

## 2024-06-07 DIAGNOSIS — J312 Chronic pharyngitis: Secondary | ICD-10-CM

## 2024-06-07 DIAGNOSIS — R718 Other abnormality of red blood cells: Secondary | ICD-10-CM | POA: Diagnosis not present

## 2024-06-07 DIAGNOSIS — I700 Atherosclerosis of aorta: Secondary | ICD-10-CM

## 2024-06-07 DIAGNOSIS — J90 Pleural effusion, not elsewhere classified: Secondary | ICD-10-CM | POA: Diagnosis not present

## 2024-06-07 DIAGNOSIS — R634 Abnormal weight loss: Secondary | ICD-10-CM

## 2024-06-07 DIAGNOSIS — K760 Fatty (change of) liver, not elsewhere classified: Secondary | ICD-10-CM | POA: Insufficient documentation

## 2024-06-07 MED ORDER — LOSARTAN POTASSIUM 100 MG PO TABS: 100.0000 mg | ORAL_TABLET | Freq: Every day | ORAL | 1 refills | Status: AC

## 2024-06-07 MED ORDER — DIAZEPAM 2 MG PO TABS: 2.0000 mg | ORAL_TABLET | Freq: Three times a day (TID) | ORAL | 1 refills | Status: DC | PRN

## 2024-06-07 MED ORDER — VARENICLINE TARTRATE (STARTER) 0.5 MG X 11 & 1 MG X 42 PO TBPK: ORAL_TABLET | ORAL | 0 refills | Status: AC

## 2024-06-07 MED ORDER — ATORVASTATIN CALCIUM 10 MG PO TABS: 10.0000 mg | ORAL_TABLET | Freq: Every day | ORAL | 0 refills | Status: DC

## 2024-06-07 MED ORDER — METOPROLOL TARTRATE 50 MG PO TABS: 50.0000 mg | ORAL_TABLET | Freq: Two times a day (BID) | ORAL | 1 refills | Status: AC

## 2024-06-07 MED ORDER — VARENICLINE TARTRATE 1 MG PO TABS: 1.0000 mg | ORAL_TABLET | Freq: Two times a day (BID) | ORAL | 3 refills | Status: AC

## 2024-06-07 MED ORDER — ALLOPURINOL 100 MG PO TABS: 100.0000 mg | ORAL_TABLET | Freq: Two times a day (BID) | ORAL | 1 refills | Status: AC

## 2024-06-07 MED ORDER — OMEPRAZOLE 40 MG PO CPDR: 40.0000 mg | DELAYED_RELEASE_CAPSULE | Freq: Every day | ORAL | 3 refills | Status: AC

## 2024-06-07 NOTE — Patient Instructions (Signed)
 Need to follow up on pulmonology.  MPRESSION: 1. Small to moderate right-sided pleural effusion with dependent atelectasis in the right lower lobe. Some of the fluid in the posterior aspect of the right upper hemithorax appears loculated. No discernible pleural thickening or pleural nodularity. Unilateral effusion does raise concern for occult underlying neoplasm. Consider thoracentesis for further assessment. 2. Hepatic steatosis.

## 2024-06-08 ENCOUNTER — Telehealth: Payer: Self-pay | Admitting: *Deleted

## 2024-06-08 ENCOUNTER — Ambulatory Visit: Payer: Self-pay | Admitting: Physician Assistant

## 2024-06-08 DIAGNOSIS — I700 Atherosclerosis of aorta: Secondary | ICD-10-CM

## 2024-06-08 DIAGNOSIS — R634 Abnormal weight loss: Secondary | ICD-10-CM | POA: Insufficient documentation

## 2024-06-08 DIAGNOSIS — J432 Centrilobular emphysema: Secondary | ICD-10-CM

## 2024-06-08 LAB — CMP14+EGFR
ALT: 25 IU/L (ref 0–44)
AST: 34 IU/L (ref 0–40)
Albumin: 4 g/dL (ref 3.8–4.9)
Alkaline Phosphatase: 90 IU/L (ref 47–123)
BUN/Creatinine Ratio: 9 (ref 9–20)
BUN: 5 mg/dL — ABNORMAL LOW (ref 6–24)
Bilirubin Total: 0.4 mg/dL (ref 0.0–1.2)
CO2: 23 mmol/L (ref 20–29)
Calcium: 8.6 mg/dL — ABNORMAL LOW (ref 8.7–10.2)
Chloride: 103 mmol/L (ref 96–106)
Creatinine, Ser: 0.54 mg/dL — ABNORMAL LOW (ref 0.76–1.27)
Globulin, Total: 2.2 g/dL (ref 1.5–4.5)
Glucose: 82 mg/dL (ref 70–99)
Potassium: 4.5 mmol/L (ref 3.5–5.2)
Sodium: 139 mmol/L (ref 134–144)
Total Protein: 6.2 g/dL (ref 6.0–8.5)
eGFR: 115 mL/min/1.73 (ref >59)

## 2024-06-08 LAB — CBC WITH DIFFERENTIAL/PLATELET
Basophils Absolute: 0 x10E3/uL (ref 0.0–0.2)
Basos: 1 %
EOS (ABSOLUTE): 0.1 x10E3/uL (ref 0.0–0.4)
Eos: 3 %
Hematocrit: 45.9 % (ref 37.5–51.0)
Hemoglobin: 15.1 g/dL (ref 13.0–17.7)
Immature Grans (Abs): 0 x10E3/uL (ref 0.0–0.1)
Immature Granulocytes: 0 %
Lymphocytes Absolute: 1.3 x10E3/uL (ref 0.7–3.1)
Lymphs: 31 %
MCH: 34.9 pg — ABNORMAL HIGH (ref 26.6–33.0)
MCHC: 32.9 g/dL (ref 31.5–35.7)
MCV: 106 fL — ABNORMAL HIGH (ref 79–97)
Monocytes Absolute: 0.6 x10E3/uL (ref 0.1–0.9)
Monocytes: 13 %
Neutrophils Absolute: 2.2 x10E3/uL (ref 1.4–7.0)
Neutrophils: 52 %
Platelets: 279 x10E3/uL (ref 150–450)
RBC: 4.33 x10E6/uL (ref 4.14–5.80)
RDW: 12.7 % (ref 11.6–15.4)
WBC: 4.3 x10E3/uL (ref 3.4–10.8)

## 2024-06-08 LAB — URIC ACID: Uric Acid: 5 mg/dL (ref 3.8–8.4)

## 2024-06-08 LAB — LIPID PANEL
Chol/HDL Ratio: 1.7 ratio (ref 0.0–5.0)
Cholesterol, Total: 106 mg/dL (ref 100–199)
HDL: 62 mg/dL (ref >39)
LDL Chol Calc (NIH): 32 mg/dL (ref 0–99)
Triglycerides: 45 mg/dL (ref 0–149)
VLDL Cholesterol Cal: 12 mg/dL (ref 5–40)

## 2024-06-08 MED ORDER — ATORVASTATIN CALCIUM 10 MG PO TABS: 10.0000 mg | ORAL_TABLET | Freq: Every day | ORAL | 3 refills | Status: AC

## 2024-06-08 MED ORDER — FLUTICASONE-SALMETEROL 250-50 MCG/ACT IN AEPB: INHALATION_SPRAY | RESPIRATORY_TRACT | 5 refills | Status: AC

## 2024-06-08 NOTE — Progress Notes (Signed)
 Established Patient Office Visit  Subjective   Patient ID: Michael Meza, male    DOB: [birth year]  Age: 58 y.o. MRN: [id number]  Chief Complaint  Patient presents with   Medical Management of Chronic Issues    HPI Discussed the use of AI scribe software for clinical note transcription with the patient, who gave verbal consent to proceed.  History of Present Illness Michael Meza is a 58 year old male who presents with ongoing wheezing and a history of right pleural effusion.  Wheezing and respiratory symptoms - Ongoing wheezing described as 'generic' in the lungs - Not using any daily inhaler due to cost constraints (approximately $80) and lack of insurance coverage - Not using a rescue inhaler - Abnormal CT scan with contrast revealed a right pleural effusion - No follow-up with pulmonology after CT scan, possibly due to lack of voicemail and not answering unknown numbers  Tobacco use - Currently smoking approximately one pack of cigarettes per day - Attempted to quit using Chantix , but did not experience any reduction in smoking or typical side effects such as vivid dreams - Current employment in a kitchen limits ability to smoke frequently  Alcohol use - Reduced alcohol consumption to approximately a twelve-pack of beer per week, sometimes less - Decreased intake due to dislike of morning aftereffects  Anxiety symptoms - Requests Valium  or another anti-anxiety medication for symptom management     ROS See HPI.  Objective:     BP (!) 140/90   Pulse 61   Ht 5' 11 (1.803 m)   Wt 176 lb (79.8 kg)   SpO2 99%   BMI 24.55 kg/m  BP Readings from Last 3 Encounters:  06/07/24 (!) 140/90  10/07/23 (!) 153/77  09/16/23 (!) 152/82   Wt Readings from Last 3 Encounters:  06/07/24 176 lb (79.8 kg)  10/07/23 180 lb (81.6 kg)  09/16/23 197 lb (89.4 kg)      Physical Exam Constitutional:      Appearance: Normal appearance.  HENT:     Head: Normocephalic.   Cardiovascular:     Rate and Rhythm: Normal rate and regular rhythm.  Pulmonary:     Effort: Pulmonary effort is normal.     Breath sounds: Wheezing and rhonchi present.     Comments: Right lower lung wheezing and rhonchi.  Musculoskeletal:     Cervical back: Neck supple. No tenderness.  Lymphadenopathy:     Cervical: No cervical adenopathy.  Neurological:     Mental Status: He is alert.  Psychiatric:     Comments: anxious          Assessment & Plan:  .Roshun was seen today for medical management of chronic issues.  Diagnoses and all orders for this visit:  Essential hypertension, benign -     losartan  (COZAAR ) 100 MG tablet; Take 1 tablet (100 mg total) by mouth daily. -     metoprolol  tartrate (LOPRESSOR ) 50 MG tablet; Take 1 tablet (50 mg total) by mouth 2 (two) times daily. -     CMP14+EGFR  Chronic gout without tophus, unspecified cause, unspecified site -     allopurinol  (ZYLOPRIM ) 100 MG tablet; Take 1 tablet (100 mg total) by mouth 2 (two) times daily. -     Uric acid  Aortic atherosclerosis -     Discontinue: atorvastatin  (LIPITOR) 10 MG tablet; Take 1 tablet (10 mg total) by mouth daily. -     Lipid panel  Chronic sore throat -  omeprazole  (PRILOSEC) 40 MG capsule; Take 1 capsule (40 mg total) by mouth daily.  Tobacco dependence -     Varenicline  Tartrate, Starter, (CHANTIX  STARTING MONTH PAK) 0.5 MG X 11 & 1 MG X 42 TBPK; Take one 0.5 mg tablet by mouth once daily for 3 days, then increase to one 0.5 mg tablet twice daily for 4 days, then increase to one 1 mg tablet twice daily. -     varenicline  (CHANTIX ) 1 MG tablet; Take 1 tablet (1 mg total) by mouth 2 (two) times daily. -     Ambulatory referral to Pulmonology  Hepatic steatosis -     CMP14+EGFR -     CBC w/Diff/Platelet -     Lipid panel  Alcohol abuse, daily use  Abnormal CT scan of lung -     Ambulatory referral to Pulmonology  Medication management -     Uric acid -      CMP14+EGFR -     CBC w/Diff/Platelet -     Lipid panel  Pleural effusion on right -     Ambulatory referral to Pulmonology  Emphysema of lung (HCC) -     AMB Referral VBCI Care Management  Generalized anxiety disorder -     diazepam  (VALIUM ) 2 MG tablet; Take 1 tablet (2 mg total) by mouth every 8 (eight) hours as needed for anxiety.  Weight loss    Assessment & Plan Right pleural effusion with concern for underlying mass Abnormal CT scan indicated right pleural effusion with potential underlying mass. Lack of follow-up with pulmonology due to communication issues. - Review previous scan results and follow up with pulmonology for further evaluation. - lost 21lbs since January 2025.  Chronic obstructive pulmonary disease (COPD) Chronic wheezing likely due to COPD. He is uninsured and unable to afford inhalers. - Coordinate with pharmacy to explore options for obtaining a rescue inhaler at reduced cost or through assistance programs. - resent advair to start  Tobacco use disorder Continues to smoke one pack per day. Previous Chantix  attempt unsuccessful. Interested in retrying Chantix , available for free. - Prescribe Chantix  starter pack and titrate to full dose. - continue to work on cutting back on cigarettes weekly  Alcohol daily use - Great job cutting back  - Consider quitting due to hepatic steatosis   HTN - refilled metoprolol  and cozaar  - BP not quite to goal  Chronic gout - recheck uric acid - refilled allopurinol   Anxiety about health  - valium  as neeed at patient request     Return in about 6 months (around 12/06/2024).    Psalm Arman, PA-C

## 2024-06-08 NOTE — Progress Notes (Unsigned)
 Care Guide Pharmacy Note  06/08/2024 Name: JUNIEL GROENE MRN: [id number] DOB: 11-Aug-1966  Referred By: [name], PA-C Reason for referral: Call Attempt #1 and Complex Care Management (Outreach to schedule referral with pharmacist )   CAMRIN GEARHEART is a 58 y.o. year old male who is a primary care patient of Breeback, [name] L, PA-C.  Reyes LITTIE Buck was referred to the pharmacist for assistance related to: emphysema   An unsuccessful telephone outreach was attempted today to contact the patient who was referred to the pharmacy team for assistance with medication assistance. Additional attempts will be made to contact the patient.  Thedford Franks, CMA   [facility], [facility] Guide Direct Dial: [phone]  Fax: [phone] Website: [email or web address]

## 2024-06-08 NOTE — Telephone Encounter (Signed)
 1st Attempt Called patient no answer and no VM to leave message. Will try again.

## 2024-06-08 NOTE — Progress Notes (Signed)
 Reyes,   Uric acid to goal.  Cholesterol to goal.  Low BUN- could be drinking too much water? How much are you drinking? Could be not eating enough? Are you getting 3 meals a day with protein?  Calcium  low. Need to increase calcium  in diet or start a 600mg  supplement a day.   MCV elevated. Need to add b12, folate, B1 to look and see if low. Smoking can make this elevated as well. Continue to keep cutting back.

## 2024-06-08 NOTE — Progress Notes (Signed)
 Just do the b12 and folate for now.

## 2024-06-09 ENCOUNTER — Telehealth: Payer: Self-pay | Admitting: Pharmacy Technician

## 2024-06-09 ENCOUNTER — Other Ambulatory Visit (HOSPITAL_COMMUNITY): Payer: Self-pay

## 2024-06-09 LAB — SPECIMEN STATUS REPORT

## 2024-06-09 LAB — B12 AND FOLATE PANEL
Folate: 5.7 ng/mL (ref >3.0)
Vitamin B-12: 313 pg/mL (ref 232–1245)

## 2024-06-09 NOTE — Progress Notes (Signed)
 Care Guide Pharmacy Note  06/09/2024 Name: Michael Meza MRN: [id number] DOB: [birth year]  Referred By: [name], PA-C Reason for referral: Call Attempt #1 and Complex Care Management (Outreach to schedule referral with pharmacist )   Michael Meza is a 58 y.o. year old male who is a primary care patient of Michael Meza, Michael L, PA-C.  Michael Meza was referred to the pharmacist for assistance related to: emphysema   Successful contact was made with the patient to discuss pharmacy services including being ready for the pharmacist to call at least 5 minutes before the scheduled appointment time and to have medication bottles and any blood pressure readings ready for review. The patient agreed to meet with the pharmacist via telephone visit on 06/14/2024  Thedford Franks, CMA [place]  [facility], [facility] Guide Direct Dial: [phone]  Fax: [phone] Website: [email or web address]

## 2024-06-09 NOTE — Progress Notes (Signed)
 Care Guide Pharmacy Note  06/09/2024 Name: Michael Meza MRN: [id number] DOB: 12/18/1965  Referred By: [name], PA-C Reason for referral: Call Attempt #1 and Complex Care Management (Outreach to schedule referral with pharmacist )   Michael Meza is a 58 y.o. year old male who is a primary care patient of Breeback, [name] L, PA-C.  Reyes LITTIE Buck was referred to the pharmacist for assistance related to: emphysema   A second unsuccessful telephone outreach was attempted today to contact the patient who was referred to the pharmacy team for assistance with medication assistance. Additional attempts will be made to contact the patient.  Thedford Franks, CMA [place]  [facility], [facility] Guide Direct Dial: [phone]  Fax: [phone] Website: [email or web address]

## 2024-06-09 NOTE — Telephone Encounter (Signed)
 Pharmacy Patient Advocate Encounter   Received notification from Onbase that prior authorization for Omeprazole  40MG  dr capsules is required/requested.   Insurance verification completed.   The patient is insured through [employer].   Per test claim: PA required; PA submitted to above mentioned insurance via Latent Key/confirmation #/[id number] Status is pending

## 2024-06-10 ENCOUNTER — Other Ambulatory Visit (HOSPITAL_COMMUNITY): Payer: Self-pay

## 2024-06-10 NOTE — Telephone Encounter (Signed)
 Pharmacy Patient Advocate Encounter  Received notification from [employer] that Prior Authorization for Omeprazole  40MG  dr capsules has been APPROVED from 06/09/2024 to 06/09/2025. Ran test claim, Copay is $4.08. This test claim was processed through [facility]- copay amounts may vary at other pharmacies due to pharmacy/plan contracts, or as the patient moves through the different stages of their insurance plan.   PA #/Case ID/Reference #: [id number]

## 2024-06-14 ENCOUNTER — Other Ambulatory Visit: Payer: Self-pay

## 2024-06-15 NOTE — Progress Notes (Signed)
 06/15/2024 Name: Michael Meza MRN: [id number] DOB: 01/17/1966  Chief Complaint  Patient presents with   Medication Assistance   Michael Meza is a 58 y.o. year old male who presented for a telephone visit.   They were referred to the pharmacist by their PCP for assistance in managing medication access.   Subjective:  Care Team: Primary Care Provider: Breeback, [name] L, PA-C ; Next Scheduled Visit: 12/07/2023  Medication Access/Adherence  Current Pharmacy:  [facility] [address] [place] - [address] [place] [address] Phone: [phone] Fax: [phone]  [facility] - [place], [place] - [address] [place] [address] [place] [address] Phone: [phone] Fax: [phone]  [id number] - [place], [place] - [address] AT [facility] [address] [name] [address] Phone: [phone] Fax: [phone]  [facility] [address] [place] - [address] [place] [address] Phone: [phone] Fax: [phone]  [phone] - [place], [place] - [address] [place] [address] Phone: [phone] Fax: [phone]  -Patient reports affordability concerns with their medications: Yes  -Patient reports access/transportation concerns to their pharmacy: No  -Patient reports adherence concerns with their medications:  Yes    Medication Management: -Patient currently employed, but is not provided insurance through his employer; and his current plan does not make the majority of his medications affordable, especially the fluticasone /salmeterol inhaler -Based on current HHI, patient would likely qualify for Medicaid -Does not qualify for [facility] Free Pharmacy program or PAPs based on having a commercial insurance plan  Objective: Lab Results  Component Value  Date   CREATININE 0.54 (L) 06/07/2024   BUN 5 (L) 06/07/2024   NA 139 06/07/2024   K 4.5 06/07/2024   CL 103 06/07/2024   CO2 23 06/07/2024   Medications Reviewed Today     Reviewed by [name] LABOR, RPH (Pharmacist) on 06/15/24 at 2003  Med List Status: <None>   Medication Order Taking? Sig Documenting Provider Last Dose Status Informant  allopurinol  (ZYLOPRIM ) 100 MG tablet [id number]  Take 1 tablet (100 mg total) by mouth 2 (two) times daily. Breeback, [name] L, PA-C  Active   atorvastatin  (LIPITOR) 10 MG tablet [id number]  Take 1 tablet (10 mg total) by mouth daily. [name], PA-C  Active   diazepam  (VALIUM ) 2 MG tablet [id number]  Take 1 tablet (2 mg total) by mouth every 8 (eight) hours as needed for anxiety. Breeback, [name] L, PA-C  Active   fluticasone -salmeterol (ADVAIR) 250-50 MCG/ACT AEPB [id number]  INHALE 1 DOSE BY MOUTH IN THE MORNING AND 1 AT BEDTIME  Patient not taking: Reported on 06/15/2024   Breeback, [name] L, PA-C  Active   losartan  (COZAAR ) 100 MG tablet [id number]  Take 1 tablet (100 mg total) by mouth daily. Breeback, [name] L, PA-C  Active   metoprolol  tartrate (LOPRESSOR ) 50 MG tablet [id number]  Take 1 tablet (50 mg total) by mouth 2 (two) times daily. [name], PA-C  Active   omeprazole  (PRILOSEC) 40 MG capsule [id number]  Take 1 capsule (40 mg total) by mouth daily. Breeback, [name] L, PA-C  Active   varenicline  (CHANTIX ) 1 MG tablet [id number]  Take 1 tablet (1 mg total) by mouth 2 (two) times daily. Breeback, [name] L, PA-C  Active   Varenicline  Tartrate, Starter, (CHANTIX   STARTING MONTH PAK) 0.5 MG X 11 & 1 MG X 42 TBPK [id number]  Take one 0.5 mg tablet by mouth once daily for 3 days, then increase to one 0.5 mg tablet twice daily for 4 days, then increase to one 1 mg tablet twice daily. [name], PA-C  Active            Assessment/Plan:   Medication Management: -Currently strategy insufficient to maintain appropriate adherence to prescribed  medication regimen -I recommend patient apply for Medicaid; this can be done online, in person, or over the phone.  Patient can call  [phone] to schedule a free appointment with a navigator to assist in applying for Medicaid.  Patient can also go in person to the [place] or [facility] Dept of Social Security to apply: [address] [place], [place] [address] [place], [place] To apply he will need:  proof of residency, proof of income, proof of DOB, social security card, and proof of [place]  citizens[place] -In the meantime, or if patient does not wish to apply to [facility], his prescriptions could be filled using  [facility] for the following prices per month (cheapest option compared to coupons like GoodRx): Allopurinol  100mg  BID $6.19Atorvastatin10mg  daily $5.34  Flutiasone/Salmeterol 250/50 BID $47.55Losartan100mg  daily $6.96Metoprolol50mg  BID $5.90 Omeprazole  40mg  daily $6.34  Follow Up Plan: Telephone visit 10/15 to further discuss applying for Medicaid and medication costs  Channing Michael Meza, PharmD, DPLA

## 2024-06-16 ENCOUNTER — Other Ambulatory Visit: Payer: Self-pay

## 2024-06-16 NOTE — Progress Notes (Signed)
   06/16/2024  Patient ID: Michael Meza, male   DOB: 19-Dec-1965, 58 y.o.   MRN: [id number]  Patient outreach to follow-up on medication adherence and affordability.  Patient endorses he recently refilled allopurinol , atorvastatin , diazepam , losartan , metoprolol  and omeprazole  at [facility] for around $21 total.  I informed him that we could prescribed generic Advair through [facility] for around $47/month without his insurance.  I am going to email him the website, so he can enroll.  I also informed him that the [facility] at [address] in [place] has staff onsite in [address] that will assist with applying to Medicaid M-F with no appointment needed.  Advised patient that he will need proof of address (photo ID), proof of income, and his social security number.  Patient plans to apply for Medicaid next week.  Channing [name], PharmD, DPLA

## 2024-07-04 ENCOUNTER — Other Ambulatory Visit: Payer: Self-pay | Admitting: Physician Assistant

## 2024-07-04 DIAGNOSIS — F411 Generalized anxiety disorder: Secondary | ICD-10-CM

## 2024-08-06 ENCOUNTER — Ambulatory Visit

## 2024-08-23 ENCOUNTER — Ambulatory Visit

## 2024-09-01 ENCOUNTER — Ambulatory Visit

## 2024-12-06 ENCOUNTER — Ambulatory Visit: Admitting: Physician Assistant
